# Patient Record
Sex: Female | Born: 1985 | Race: Black or African American | Hispanic: No | Marital: Single | Smoking: Current every day smoker
Health system: Southern US, Community
[De-identification: ages and names within clinical notes are randomized; demographics above are authoritative.]

## PROBLEM LIST (undated history)

## (undated) ENCOUNTER — Inpatient Hospital Stay: Payer: Self-pay

## (undated) DIAGNOSIS — I509 Heart failure, unspecified: Secondary | ICD-10-CM

## (undated) DIAGNOSIS — J45909 Unspecified asthma, uncomplicated: Secondary | ICD-10-CM

## (undated) DIAGNOSIS — K219 Gastro-esophageal reflux disease without esophagitis: Secondary | ICD-10-CM

## (undated) DIAGNOSIS — I10 Essential (primary) hypertension: Secondary | ICD-10-CM

## (undated) DIAGNOSIS — O1490 Unspecified pre-eclampsia, unspecified trimester: Secondary | ICD-10-CM

## (undated) HISTORY — DX: Unspecified asthma, uncomplicated: J45.909

## (undated) HISTORY — PX: DILATION AND CURETTAGE OF UTERUS: SHX78

---

## 2008-11-28 ENCOUNTER — Emergency Department: Payer: Self-pay | Admitting: Internal Medicine

## 2008-12-30 ENCOUNTER — Emergency Department: Payer: Self-pay | Admitting: Emergency Medicine

## 2009-06-04 ENCOUNTER — Emergency Department: Payer: Self-pay | Admitting: Emergency Medicine

## 2009-08-24 ENCOUNTER — Encounter: Payer: Self-pay | Admitting: Obstetrics and Gynecology

## 2009-09-01 ENCOUNTER — Emergency Department: Payer: Self-pay | Admitting: Emergency Medicine

## 2009-09-02 ENCOUNTER — Emergency Department: Payer: Self-pay | Admitting: Emergency Medicine

## 2009-12-21 ENCOUNTER — Observation Stay: Payer: Self-pay

## 2010-01-15 ENCOUNTER — Inpatient Hospital Stay: Payer: Self-pay

## 2010-01-20 ENCOUNTER — Inpatient Hospital Stay: Payer: Self-pay | Admitting: Internal Medicine

## 2012-03-04 ENCOUNTER — Emergency Department: Payer: Self-pay | Admitting: Emergency Medicine

## 2012-03-04 LAB — CBC
HCT: 32.3 % — ABNORMAL LOW (ref 35.0–47.0)
HGB: 10.7 g/dL — ABNORMAL LOW (ref 12.0–16.0)
MCHC: 33.1 g/dL (ref 32.0–36.0)
Platelet: 241 10*3/uL (ref 150–440)
RBC: 3.75 10*6/uL — ABNORMAL LOW (ref 3.80–5.20)
WBC: 6.9 10*3/uL (ref 3.6–11.0)

## 2012-03-04 LAB — COMPREHENSIVE METABOLIC PANEL
Albumin: 2.9 g/dL — ABNORMAL LOW (ref 3.4–5.0)
Alkaline Phosphatase: 42 U/L — ABNORMAL LOW (ref 50–136)
Anion Gap: 7 (ref 7–16)
BUN: 7 mg/dL (ref 7–18)
Bilirubin,Total: 0.2 mg/dL (ref 0.2–1.0)
Creatinine: 0.74 mg/dL (ref 0.60–1.30)
EGFR (African American): >60
EGFR (Non-African Amer.): >60
Glucose: 80 mg/dL (ref 65–99)
Osmolality: 274 (ref 275–301)
Potassium: 3.6 mmol/L (ref 3.5–5.1)
SGOT(AST): 14 U/L — ABNORMAL LOW (ref 15–37)
SGPT (ALT): 15 U/L
Total Protein: 7.1 g/dL (ref 6.4–8.2)

## 2012-03-04 LAB — URINALYSIS, COMPLETE
Bilirubin,UR: NEGATIVE
Glucose,UR: NEGATIVE mg/dL (ref 0–75)
Ketone: NEGATIVE
Ph: 8 (ref 4.5–8.0)
RBC,UR: 1 /HPF (ref 0–5)
Specific Gravity: 1.017 (ref 1.003–1.030)
Squamous Epithelial: 13
WBC UR: 3 /HPF (ref 0–5)

## 2012-04-21 ENCOUNTER — Observation Stay: Payer: Self-pay

## 2012-05-04 ENCOUNTER — Encounter: Payer: Self-pay | Admitting: Maternal & Fetal Medicine

## 2012-05-15 ENCOUNTER — Ambulatory Visit: Payer: Self-pay | Admitting: Family Medicine

## 2012-06-14 ENCOUNTER — Observation Stay: Payer: Self-pay

## 2012-07-19 ENCOUNTER — Inpatient Hospital Stay: Payer: Self-pay

## 2012-07-19 LAB — CBC WITH DIFFERENTIAL/PLATELET
Basophil %: 0.4 %
Eosinophil #: 0.1 10*3/uL (ref 0.0–0.7)
Eosinophil %: 0.7 %
HCT: 32.9 % — ABNORMAL LOW (ref 35.0–47.0)
Lymphocyte #: 2.3 10*3/uL (ref 1.0–3.6)
MCH: 28 pg (ref 26.0–34.0)
MCHC: 32.4 g/dL (ref 32.0–36.0)
Monocyte #: 0.7 x10 3/mm (ref 0.2–0.9)
Neutrophil %: 65.2 %
RBC: 3.81 10*6/uL (ref 3.80–5.20)
RDW: 13.6 % (ref 11.5–14.5)

## 2012-07-19 LAB — DRUG SCREEN, URINE
Barbiturates, Ur Screen: NEGATIVE (ref ?–200)
Benzodiazepine, Ur Scrn: NEGATIVE (ref ?–200)
Cocaine Metabolite,Ur ~~LOC~~: NEGATIVE (ref ?–300)
Methadone, Ur Screen: NEGATIVE (ref ?–300)
Phencyclidine (PCP) Ur S: NEGATIVE (ref ?–25)
Tricyclic, Ur Screen: NEGATIVE (ref ?–1000)

## 2012-07-20 LAB — HEMATOCRIT: HCT: 29.4 % — ABNORMAL LOW (ref 35.0–47.0)

## 2014-11-10 ENCOUNTER — Emergency Department: Payer: Self-pay | Admitting: Emergency Medicine

## 2014-11-10 LAB — COMPREHENSIVE METABOLIC PANEL
Albumin: 3.4 g/dL (ref 3.4–5.0)
Alkaline Phosphatase: 45 U/L — ABNORMAL LOW
Anion Gap: 10 (ref 7–16)
BILIRUBIN TOTAL: 0.2 mg/dL (ref 0.2–1.0)
BUN: 11 mg/dL (ref 7–18)
CALCIUM: 8.6 mg/dL (ref 8.5–10.1)
Chloride: 106 mmol/L (ref 98–107)
Co2: 26 mmol/L (ref 21–32)
Creatinine: 0.87 mg/dL (ref 0.60–1.30)
EGFR (African American): >60
EGFR (Non-African Amer.): >60
GLUCOSE: 89 mg/dL (ref 65–99)
Osmolality: 282 (ref 275–301)
Potassium: 3.9 mmol/L (ref 3.5–5.1)
SGOT(AST): 17 U/L (ref 15–37)
SGPT (ALT): 17 U/L
Sodium: 142 mmol/L (ref 136–145)
Total Protein: 7.3 g/dL (ref 6.4–8.2)

## 2014-11-10 LAB — CBC
HCT: 38.2 % (ref 35.0–47.0)
HGB: 12.1 g/dL (ref 12.0–16.0)
MCH: 27.5 pg (ref 26.0–34.0)
MCHC: 31.7 g/dL — ABNORMAL LOW (ref 32.0–36.0)
MCV: 87 fL (ref 80–100)
Platelet: 318 10*3/uL (ref 150–440)
RBC: 4.4 10*6/uL (ref 3.80–5.20)
RDW: 13 % (ref 11.5–14.5)
WBC: 8.2 10*3/uL (ref 3.6–11.0)

## 2014-11-10 LAB — LIPASE, BLOOD: LIPASE: 115 U/L (ref 73–393)

## 2014-11-10 LAB — HCG, QUANTITATIVE, PREGNANCY: BETA HCG, QUANT.: 2953 m[IU]/mL — AB

## 2015-05-09 NOTE — H&P (Signed)
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  Her past obstetrical history is notable for a history of post-partum severe preeclampsia, with CHF and pleural effusions.  She had "good left ventricular function" on ECHO. She was discharged from that admission on Lasix.  She presents with SROM at about 1.5 hours (midnight) prior to presenting. She notes no vaginal bleeding, positive fetal movement, and contractions. blood type O neg, RPR NR, RI, NGsAg neg, VZI, +GBS bacteruria.    Patient's Medical History No Chronic Illness    Patient's Surgical History none    Medications Pre Natal Vitamins  Iron    Allergies NKDA    Social History 2 cigs/day, denies EtOH and drug use    Family History Non-Contributory   ROS:   ROS All systems were reviewed.  HEENT, CNS, GI, GU, Respiratory, CV, Renal and Musculoskeletal systems were found to be normal., unless noted in HPI   Exam:   Vital Signs T 36.8, BP 122/88, P 83    General no apparent distress    Mental Status clear    Chest clear    Heart normal sinus rhythm    Abdomen gravid, non-tender    Estimated Fetal Weight Average for gestational age, 8# (largest baby =7lbs, 8oz)    Fetal Position vertex    Back no CVAT    Edema no edema    Pelvic 4cm per RN    Mebranes Ruptured    Description clear    FHT normal rate with no decels    Fetal Heart Rate 130    Ucx irregular, 3-4 q 10 min   Impression:   Impression active labor, Spontaneous rupture of membranes   Plan:   Plan EFM/NST, monitor contractions and for cervical change, antibiotics for GBBS prophylaxis, fluids    Comments -admit for labor and spontaneous rupture of membranes - amoxicillin for gbs prophylaxis - IVF and clear liquids - T&S and  CBC - expectant management initially until antibiotics on board for GBS+ - urine drug screen for marijuana this pregnancy - she desires an epidural at some point.   Electronic Signatures: Conard NovakJackson, Levante Simones D (MD)  (Signed 21-Jul-13 02:10)  Authored: L&D Evaluation   Last Updated: 21-Jul-13 02:10 by Conard NovakJackson, Jacelyn Cuen D (MD)

## 2015-08-15 ENCOUNTER — Emergency Department
Admission: EM | Admit: 2015-08-15 | Discharge: 2015-08-15 | Disposition: A | Discharge status: Home / Self Care | Payer: Self-pay | Attending: Emergency Medicine | Admitting: Emergency Medicine

## 2015-08-15 DIAGNOSIS — N390 Urinary tract infection, site not specified: Secondary | ICD-10-CM

## 2015-08-15 DIAGNOSIS — Z3491 Encounter for supervision of normal pregnancy, unspecified, first trimester: Secondary | ICD-10-CM

## 2015-08-15 DIAGNOSIS — Z3A13 13 weeks gestation of pregnancy: Secondary | ICD-10-CM | POA: Insufficient documentation

## 2015-08-15 DIAGNOSIS — O2341 Unspecified infection of urinary tract in pregnancy, first trimester: Secondary | ICD-10-CM | POA: Insufficient documentation

## 2015-08-15 LAB — BASIC METABOLIC PANEL
Anion gap: 6 (ref 5–15)
BUN: 11 mg/dL (ref 6–20)
CALCIUM: 8.6 mg/dL — AB (ref 8.9–10.3)
CO2: 24 mmol/L (ref 22–32)
CREATININE: 0.75 mg/dL (ref 0.44–1.00)
Chloride: 109 mmol/L (ref 101–111)
GFR calc non Af Amer: >60 mL/min (ref >60)
Glucose, Bld: 89 mg/dL (ref 65–99)
Potassium: 4.3 mmol/L (ref 3.5–5.1)
SODIUM: 139 mmol/L (ref 135–145)

## 2015-08-15 LAB — URINALYSIS COMPLETE WITH MICROSCOPIC (ARMC ONLY)
Bilirubin Urine: NEGATIVE
GLUCOSE, UA: NEGATIVE mg/dL
KETONES UR: NEGATIVE mg/dL
NITRITE: NEGATIVE
Protein, ur: NEGATIVE mg/dL
SPECIFIC GRAVITY, URINE: 1.015 (ref 1.005–1.030)
pH: 6 (ref 5.0–8.0)

## 2015-08-15 LAB — PREGNANCY, URINE: Preg Test, Ur: POSITIVE — AB

## 2015-08-15 LAB — CBC
HCT: 35.7 % (ref 35.0–47.0)
HEMOGLOBIN: 11.6 g/dL — AB (ref 12.0–16.0)
MCH: 27.3 pg (ref 26.0–34.0)
MCHC: 32.4 g/dL (ref 32.0–36.0)
MCV: 84.4 fL (ref 80.0–100.0)
Platelets: 221 10*3/uL (ref 150–440)
RBC: 4.23 MIL/uL (ref 3.80–5.20)
RDW: 13.1 % (ref 11.5–14.5)
WBC: 10.1 10*3/uL (ref 3.6–11.0)

## 2015-08-15 MED ORDER — CEPHALEXIN 500 MG PO CAPS: 500.0000 mg | ORAL_CAPSULE | Freq: Four times a day (QID) | ORAL | Status: AC

## 2015-08-15 MED ORDER — SODIUM CHLORIDE 0.9 % IV BOLUS (SEPSIS)
1000.0000 mL | Freq: Once | INTRAVENOUS | Status: AC
  Administered 2015-08-15: 1000 mL via INTRAVENOUS

## 2015-08-15 MED ORDER — CEFTRIAXONE SODIUM 1 G IJ SOLR
1.0000 g | INTRAMUSCULAR | Status: DC
  Administered 2015-08-15: 1 g via INTRAVENOUS
  Filled 2015-08-15: qty 10

## 2015-08-15 NOTE — ED Notes (Addendum)
Pt in with lower back pain and and generalized abd pain states hx of the same and was dx with UTI>.   Pt is pregnant but unsure of gestation, is scheduled for abortion Thursday.

## 2015-08-15 NOTE — ED Provider Notes (Signed)
CSN: [id number]     Arrival date & time 08/15/15  0446 History   First MD Initiated Contact with Patient 08/15/15 [phone]     Chief Complaint  Patient presents with  . Abdominal Pain     (Consider location/radiation/quality/duration/timing/severity/associated sxs/prior Treatment) The history is provided by the patient.  Natasha Christian is a 29 y.o. female here with lower abdominal pain, dysuria. Patient has been having lower abdominal pain as well as curious since yesterday. Also some low back pain as well. Had one episode of vomiting that resolved. Denies any fevers or chills. Patient's last menstrual period was middle of May and she states that she may be "weeks pregnant but is scheduled for an abortion later this week. Denies any vaginal bleeding.    No past medical history on file. No past surgical history on file. No family history on file. Social History  Substance Use Topics  . Smoking status: Not on file  . Smokeless tobacco: Not on file  . Alcohol Use: Not on file   OB History    No data available     Review of Systems  Gastrointestinal: Positive for vomiting and abdominal pain.  All other systems reviewed and are negative.     Allergies  Review of patient's allergies indicates no known allergies.  Home Medications   Prior to Admission medications   Not on File   BP 109/66 mmHg  Pulse 52  Temp(Src) 97.8 F (36.6 C) (Oral)  Resp 18  Ht  (1.727 m)  Wt 200 lb (90.719 kg)  BMI 30.42 kg/m2  SpO2 99%  LMP 05/13/2015 Physical Exam  Constitutional: She is oriented to person, place, and time. She appears well-developed and well-nourished.  HENT:  Head: Normocephalic.  Mouth/Throat: Oropharynx is clear and moist.  Eyes: Conjunctivae are normal. Pupils are equal, round, and reactive to light.  Neck: Normal range of motion. Neck supple.  Cardiovascular: Normal rate, regular rhythm and normal heart sounds.   Pulmonary/Chest: Effort normal and breath sounds  normal. No respiratory distress. She has no wheezes. She has no rales.  Abdominal: Soft. Bowel sounds are normal.  Minimal diffuse lower abdominal tenderness, mostly uterine area   Musculoskeletal: Normal range of motion. She exhibits no edema or tenderness.  Neurological: She is alert and oriented to person, place, and time. No cranial nerve deficit. Coordination normal.  Skin: Skin is warm and dry.  Psychiatric: She has a normal mood and affect. Her behavior is normal. Judgment and thought content normal.  Nursing note and vitals reviewed.   ED Course  Procedures (including critical care time)  EMERGENCY DEPARTMENT [place] PREGNANCY "Study: Limited Ultrasound of the Pelvis"  INDICATIONS:Pregnancy(required) Multiple views of the uterus and pelvic cavity are obtained with a multi-frequency probe.  APPROACH:Transabdominal   PERFORMED BY: Myself  IMAGES ARCHIVED?: Yes  LIMITATIONS: Body habitus  PREGNANCY FREE FLUID: None  PREGNANCY UTERUS FINDINGS:Uterus enlarged ADNEXAL FINDINGS:Left ovary not seen and Right ovary not seen  PREGNANCY FINDINGS: Intrauterine gestational sac noted, Yolk sac noted, Fetal pole present and Fetal heart activity seen  INTERPRETATION: Viable intrauterine pregnancy  GESTATIONAL AGE, ESTIMATE: 12 weeks 4 days  FETAL HEART RATE: 150  COMMENT(Estimate of Gestational Age):       Labs Review Labs Reviewed  CBC - Abnormal; Notable for the following:    Hemoglobin 11.6 (*)    All other components within normal limits  BASIC METABOLIC PANEL - Abnormal; Notable for the following:    Calcium 8.6 (*)  All other components within normal limits  URINALYSIS COMPLETEWITH MICROSCOPIC ([facility] ONLY) - Abnormal; Notable for the following:    Color, Urine YELLOW (*)    APPearance CLOUDY (*)    Hgb urine dipstick 1+ (*)    Leukocytes, UA 3+ (*)    Bacteria, UA RARE (*)    Squamous Epithelial / LPF TOO NUMEROUS TO COUNT (*)    All other components within  normal limits  PREGNANCY, URINE - Abnormal; Notable for the following:    Preg Test, Ur POSITIVE (*)    All other components within normal limits    Imaging Review No results found. I, YAO, DAVID, personally reviewed and evaluated these images and lab results as part of my medical decision-making.   EKG Interpretation None      MDM   Final diagnoses:  None    Natasha Christian is a 29 y.o. female here with lower ab pain, dysuria. No vaginal bleeding. Likely UTI. IUP confirmed by bedside US. Will check labs, UA, hydrate and reassess.   8:22 AM UA + UTI. Labs otherwise unremarkable. Given rocephin, will dc home with keflex. Has abortion scheduled in several days.    Richardean Canal, MD 08/15/15 [phone]

## 2015-08-15 NOTE — Discharge Instructions (Signed)
Stay hydrated.  Take tylenol for pain.  Take keflex four times daily for a week.   See your OB doctor.   Return to ER if you have fever, vomiting, vaginal bleeding, severe abdominal pain

## 2015-08-17 LAB — URINE CULTURE

## 2015-11-13 ENCOUNTER — Telehealth: Payer: Self-pay | Admitting: Obstetrics and Gynecology

## 2015-11-13 ENCOUNTER — Encounter: Payer: Self-pay | Admitting: *Deleted

## 2015-11-13 ENCOUNTER — Observation Stay
Admission: EM | Admit: 2015-11-13 | Discharge: 2015-11-13 | Disposition: A | Discharge status: Home / Self Care | Payer: Self-pay | Attending: Obstetrics and Gynecology | Admitting: Obstetrics and Gynecology

## 2015-11-13 DIAGNOSIS — N760 Acute vaginitis: Secondary | ICD-10-CM | POA: Insufficient documentation

## 2015-11-13 DIAGNOSIS — O26899 Other specified pregnancy related conditions, unspecified trimester: Secondary | ICD-10-CM

## 2015-11-13 DIAGNOSIS — B9689 Other specified bacterial agents as the cause of diseases classified elsewhere: Secondary | ICD-10-CM | POA: Diagnosis present

## 2015-11-13 DIAGNOSIS — O23592 Infection of other part of genital tract in pregnancy, second trimester: Principal | ICD-10-CM | POA: Insufficient documentation

## 2015-11-13 DIAGNOSIS — Z3A25 25 weeks gestation of pregnancy: Secondary | ICD-10-CM | POA: Insufficient documentation

## 2015-11-13 DIAGNOSIS — O0932 Supervision of pregnancy with insufficient antenatal care, second trimester: Secondary | ICD-10-CM

## 2015-11-13 DIAGNOSIS — R109 Unspecified abdominal pain: Secondary | ICD-10-CM

## 2015-11-13 DIAGNOSIS — O99332 Smoking (tobacco) complicating pregnancy, second trimester: Secondary | ICD-10-CM | POA: Insufficient documentation

## 2015-11-13 HISTORY — DX: Heart failure, unspecified: I50.9

## 2015-11-13 LAB — URINALYSIS COMPLETE WITH MICROSCOPIC (ARMC ONLY)
BACTERIA UA: NONE SEEN
Bilirubin Urine: NEGATIVE
Glucose, UA: NEGATIVE mg/dL
Hgb urine dipstick: NEGATIVE
KETONES UR: NEGATIVE mg/dL
Nitrite: NEGATIVE
PROTEIN: NEGATIVE mg/dL
Specific Gravity, Urine: 1.02 (ref 1.005–1.030)
pH: 6 (ref 5.0–8.0)

## 2015-11-13 LAB — TYPE AND SCREEN
ABO/RH(D): O NEG
ANTIBODY SCREEN: NEGATIVE

## 2015-11-13 LAB — PROTEIN / CREATININE RATIO, URINE
CREATININE, URINE: 190 mg/dL
Protein Creatinine Ratio: 0.07 mg/mg{Cre} (ref 0.00–0.15)
Total Protein, Urine: 13 mg/dL

## 2015-11-13 LAB — CBC
HCT: 32.2 % — ABNORMAL LOW (ref 35.0–47.0)
HEMOGLOBIN: 10.7 g/dL — AB (ref 12.0–16.0)
MCH: 28.8 pg (ref 26.0–34.0)
MCHC: 33.2 g/dL (ref 32.0–36.0)
MCV: 86.8 fL (ref 80.0–100.0)
Platelets: 197 10*3/uL (ref 150–440)
RBC: 3.71 MIL/uL — AB (ref 3.80–5.20)
RDW: 13.1 % (ref 11.5–14.5)
WBC: 8.8 10*3/uL (ref 3.6–11.0)

## 2015-11-13 LAB — URINE DRUG SCREEN, QUALITATIVE (ARMC ONLY)
Amphetamines, Ur Screen: NOT DETECTED — AB
BARBITURATES, UR SCREEN: NOT DETECTED — AB
BENZODIAZEPINE, UR SCRN: NOT DETECTED — AB
CANNABINOID 50 NG, UR ~~LOC~~: POSITIVE — AB
Cocaine Metabolite,Ur ~~LOC~~: NOT DETECTED — AB
MDMA (Ecstasy)Ur Screen: NOT DETECTED — AB
METHADONE SCREEN, URINE: NOT DETECTED — AB
OPIATE, UR SCREEN: NOT DETECTED — AB
Phencyclidine (PCP) Ur S: NOT DETECTED — AB
TRICYCLIC, UR SCREEN: NOT DETECTED — AB

## 2015-11-13 LAB — RAPID HIV SCREEN (HIV 1/2 AB+AG)
HIV 1/2 Antibodies: NONREACTIVE
HIV-1 P24 Antigen - HIV24: NONREACTIVE

## 2015-11-13 LAB — COMPREHENSIVE METABOLIC PANEL
ALK PHOS: 51 U/L (ref 38–126)
ALT: 14 U/L (ref 14–54)
ANION GAP: 5 (ref 5–15)
AST: 16 U/L (ref 15–41)
Albumin: 2.9 g/dL — ABNORMAL LOW (ref 3.5–5.0)
BILIRUBIN TOTAL: 0.1 mg/dL — AB (ref 0.3–1.2)
BUN: 8 mg/dL (ref 6–20)
CALCIUM: 8.3 mg/dL — AB (ref 8.9–10.3)
CO2: 24 mmol/L (ref 22–32)
Chloride: 107 mmol/L (ref 101–111)
Creatinine, Ser: 0.62 mg/dL (ref 0.44–1.00)
Glucose, Bld: 81 mg/dL (ref 65–99)
Potassium: 3.5 mmol/L (ref 3.5–5.1)
Sodium: 136 mmol/L (ref 135–145)
TOTAL PROTEIN: 6.6 g/dL (ref 6.5–8.1)

## 2015-11-13 LAB — CHLAMYDIA/NGC RT PCR (ARMC ONLY)
Chlamydia Tr: NOT DETECTED
N GONORRHOEAE: NOT DETECTED

## 2015-11-13 MED ORDER — METRONIDAZOLE 500 MG PO TABS: 500.0000 mg | ORAL_TABLET | Freq: Two times a day (BID) | ORAL | Status: AC

## 2015-11-13 NOTE — Progress Notes (Signed)
No further monitoring required, per MD

## 2015-11-13 NOTE — Discharge Instructions (Signed)

## 2015-11-13 NOTE — OB Triage Note (Signed)
Pt. Present with concerns of vaginal pressure for the last couple of weeks, no vaginal bleeding or discharge.  Has not received prenatal care because "I was undecided about having this baby" due to transportation problems and housing. Pt. Lives with her dad, along with her three children.

## 2015-11-13 NOTE — Final Progress Note (Signed)
Physician Final Progress Note  Patient ID: Natasha Christian MRN: [id number] DOB/AGE: 1986-02-20 29 y.o.  Admit date: 11/13/2015 Admitting provider: Osmond Bing, MD Discharge date: 11/13/2015  6:17 PM Primary OBGYN: No prenatal care  Chief Complaint: low belly pressure x 2 weeks  History of Present Illness  29 y.o. [id number] @ 25/3 (Dating: EDC 2/24, based on bedside ER u/s at 12wks), with the above CC. Pregnancy complicated by: no PNC, h/o pre-eclampsia with severe features and/or ?cardiomyopathy, h/o CIN 2, poor patient follow up, tobacco abuse, h/o GBS bacteruria and STIs.  Ms. Natasha Christian states that she hasn't had any PNC from any provider, aside from the one [facility] ER visit. Two week history of stable to ?worsening today with low belly vaginal/belly pressure. No decreased FM, VB, LOF or anything per vagina in the past two days. Patient works a Marine scientist job and was wondering if this contributed to her s/s. No chest pain, SOB, dysuria, hematuria  Review of Systems: her 12 point review of systems is negative or as noted in the History of Present Illness.   PMHx:  Past Medical History  Diagnosis Date  . CHF (congestive heart failure) (HCC)     After giving birth 2011, pt. has to return to hospital for "fluid around lungs and heart".    PSHx:  Past Surgical History  Procedure Laterality Date  . Dilation and curettage of uterus      elective AB     Medications:  PNV  Allergies: has No Known Allergies. OBHx:  OB History  Gravida Para Term Preterm AB SAB TAB Ectopic Multiple Living  0 2 1 0 0 0 3    # Outcome Date GA Lbr Len/2nd Weight Sex Delivery Anes PTL Lv  6 Current           5 SAB 10/30/14        FD  4 Term 07/19/12   6 lb 12 oz (3.062 kg) M Vag-Spont   Y  3 Term 01/16/10   6 lb 11 oz (3.033 kg) M Vag-Spont  N Y  2 AB 03/30/09        FD  1 Gravida 12/18/03   7 lb 11 oz (3.487 kg) M Vag-Spont  N Y     GYNHx:  History of abnormal pap smears: h/o CIN 2 and  unknown follow up by patient History of STIs: Yes.  Marland Kitchen             FHx: History reviewed. No pertinent family history. Soc Hx:  Social History   Social History  . Marital Status: Single    Spouse Name: N/A  . Number of Children: N/A  . Years of Education: N/A   Occupational History  . Not on file.   Social History Main Topics  . Smoking status: Current Every Day Smoker    Types: Cigarettes  . Smokeless tobacco: Not on file  . Alcohol Use: No  . Drug Use: No  . Sexual Activity: Not Currently   Other Topics Concern  . Not on file   Social History Narrative  . No narrative on file    Objective    Current Vital Signs 24h Vital Sign Ranges  T 99 F (37.2 C) Temp  Avg: 99 F (37.2 C)  Min: 99 F (37.2 C)  Max: 99 F (37.2 C)  BP (!) 115/55 mmHg BP  Min: 115/55  Max: 115/55  HR 72 Pulse  Avg: 72  Min: 72  Max: 72  RR 18 Resp  Avg: 18  Min: 18  Max: 18  SaO2    98/RA No Data Recorded       24 Hour I/O Current Shift I/O  Time Ins Outs       EFM: 130 baseline, +accels, no decels, mod variability  Toco: quiet  General: Well nourished, well developed female in no acute distress.  Skin:  Warm and dry.  Cardiovascular: Regular rate and rhythm. Respiratory:  Clear to auscultation bilateral. Normal respiratory effort Abdomen: gravid, nttp.  Neuro/Psych:  Normal mood and affect.   SSE: visually closed, normal minimal white d/c in vault, somewhat malodorous SVE: cl/long/high  Labs  +nitrazine WP: numerous WBCs and clue cells. No trich  Radiology BSUS with SLIUP with normal FHR, subjectively normal fluid, cephalic, anterior placenta and no e/o previa   Assessment & Plan  No e/o PTL or cervical dilation; pt with BV.  *IUP: fetal status reassuring with rNST. Pt told that BSUS was just for quick assessment and not to check anatomy and that she still needs a formal u/s *No PNC: UDS and NOB panel sent along with GC/CT. FFN not sent given negative toco and exam. Pt  states she has [facility] appt next week and was told to keep that *?h/o CM: no e/o sequelae. Pt [facility]; pt had normal 2013 VD and no issues PP. Needs baseline echo at some point. Stressed to patient importance of follow up *ID: follow up labs. D/c home with flagyl  Natasha Christian, Jr. MD [facility] Pager [phone]

## 2015-11-14 LAB — RUBELLA SCREEN

## 2015-11-14 LAB — ABO/RH: ABO/RH(D): O NEG

## 2015-11-14 LAB — VARICELLA ZOSTER ANTIBODY, IGG: VARICELLA IGG: 615 {index} (ref >165)

## 2015-11-14 LAB — HEPATITIS B SURFACE ANTIGEN: HEP B S AG: NEGATIVE

## 2015-11-14 LAB — RPR: RPR: NONREACTIVE

## 2015-11-15 LAB — URINE CULTURE

## 2016-09-17 ENCOUNTER — Encounter (HOSPITAL_COMMUNITY): Payer: Self-pay

## 2017-02-20 ENCOUNTER — Emergency Department
Admission: EM | Admit: 2017-02-20 | Discharge: 2017-02-20 | Disposition: A | Discharge status: Home / Self Care | Payer: Managed Care, Other (non HMO) | Attending: Emergency Medicine | Admitting: Emergency Medicine

## 2017-02-20 DIAGNOSIS — J069 Acute upper respiratory infection, unspecified: Secondary | ICD-10-CM | POA: Insufficient documentation

## 2017-02-20 DIAGNOSIS — L0231 Cutaneous abscess of buttock: Secondary | ICD-10-CM | POA: Diagnosis not present

## 2017-02-20 DIAGNOSIS — F17210 Nicotine dependence, cigarettes, uncomplicated: Secondary | ICD-10-CM | POA: Diagnosis not present

## 2017-02-20 DIAGNOSIS — I509 Heart failure, unspecified: Secondary | ICD-10-CM | POA: Diagnosis not present

## 2017-02-20 DIAGNOSIS — B9789 Other viral agents as the cause of diseases classified elsewhere: Secondary | ICD-10-CM

## 2017-02-20 DIAGNOSIS — R05 Cough: Secondary | ICD-10-CM | POA: Diagnosis present

## 2017-02-20 MED ORDER — SULFAMETHOXAZOLE-TRIMETHOPRIM 800-160 MG PO TABS: 1.0000 | ORAL_TABLET | Freq: Two times a day (BID) | ORAL | 0 refills | Status: DC

## 2017-02-20 MED ORDER — LIDOCAINE HCL (PF) 1 % IJ SOLN
INTRAMUSCULAR | Status: AC
  Filled 2017-02-20: qty 5

## 2017-02-20 MED ORDER — HYDROCODONE-ACETAMINOPHEN 5-325 MG PO TABS: 1.0000 | ORAL_TABLET | ORAL | 0 refills | Status: DC | PRN

## 2017-02-20 MED ORDER — LIDOCAINE HCL (PF) 1 % IJ SOLN
5.0000 mL | Freq: Once | INTRAMUSCULAR | Status: AC
  Administered 2017-02-20: 09:00:00

## 2017-02-20 NOTE — Discharge Instructions (Signed)
Begin taking Bactrim DS twice a day for 10 days. Norco as needed for pain. You  cannot drive or operate machinery while taking this medication as it could cause drowsiness. [facility] packing removal in 2 days. You  should also begin at that time using warm compresses or sitting in warm water frequently. Your should  also follow up with a surgeon.  Dr. Evette CristalSankar is the surgeon on call.

## 2017-02-20 NOTE — ED Notes (Signed)
Pt c/o flu like symptoms at this time this time, c/o cough, nausea, 1 episode of vomiting. Pt also c/o abscess to inner R buttock at this time, redness and swelling noted at this time. Pt is alert and oriented. Skin warm, dry, and intact, respirations even and unlabored at this time.

## 2017-02-20 NOTE — ED Triage Notes (Signed)
Pt c/o cough with congestion, pt also c/o "boil" to her buttock for the past 2 days.

## 2017-02-20 NOTE — ED Provider Notes (Signed)
[facility] Emergency Department Provider Note  ____________________________________________   First MD Initiated Contact with Patient 02/20/17 0848     (approximate)  I have reviewed the triage vital signs and the nursing notes.   HISTORY  Chief Complaint Abscess and Cough   HPI Natasha SparkCiarra Christian is a 31 y.o. female is here with 2 different complaints. Patient first complains of cough and congestion for approximately one week. She denies any fever or chills. There's been no nausea or vomiting. She states that there is a nonproductive cough. She's been takes over-the-counter medication with some relief of her cough. She is also here because she has "boil". To her right buttocks that is started in the last 2 days. Patient states there is been several years since she has had one but that also had to be lanced. Patient rates her pain as 10 over 10. Patient drove herself to the emergency room and there are no other people with her.   Past Medical History:  Diagnosis Date  . CHF (congestive heart failure) (HCC)    After giving birth 2011, pt. has to return to hospital for "fluid around lungs and heart".     Patient Active Problem List   Diagnosis Date Noted  . No prenatal care in current pregnancy in second trimester 11/13/2015  . Abdominal pain affecting pregnancy, antepartum 11/13/2015  . Bacterial vaginosis 11/13/2015    Past Surgical History:  Procedure Laterality Date  . DILATION AND CURETTAGE OF UTERUS     elective AB    Prior to Admission medications   Medication Sig Start Date End Date Taking? Authorizing Provider  HYDROcodone-acetaminophen (NORCO/VICODIN) 5-325 MG tablet Take 1 tablet by mouth every 4 (four) hours as needed for moderate pain. 02/20/17   Tommi Rumpshonda L Abigail Marsiglia, PA-C  Prenatal Vit-Fe Fumarate-FA (PRENATAL MULTIVITAMIN) TABS tablet Take 1 tablet by mouth daily at 12 noon.    Historical Provider, MD  sulfamethoxazole-trimethoprim (BACTRIM  DS,SEPTRA DS) 800-160 MG tablet Take 1 tablet by mouth 2 (two) times daily. 02/20/17   Tommi Rumpshonda L Harshini Trent, PA-C    Allergies Patient has no known allergies.  No family history on file.  Social History Social History  Substance Use Topics  . Smoking status: Current Every Day Smoker    Types: Cigarettes  . Smokeless tobacco: Never Used  . Alcohol use No    Review of Systems Constitutional: No fever/chills Eyes: No visual changes. ENT: No sore throat.Positive nasal congestion. Cardiovascular: Denies chest pain. Respiratory: Denies shortness of breath. Positive nonproductive cough. Gastrointestinal: No abdominal pain.  No nausea, no vomiting.  No diarrhea. Musculoskeletal: Negative for back pain. Skin: Positive for abscesses. Neurological: Negative for headaches, focal weakness or numbness.  10-point ROS otherwise negative.  ____________________________________________   PHYSICAL EXAM:  VITAL SIGNS: ED Triage Vitals  Enc Vitals Group     BP 02/20/17 0832 138/90     Pulse Rate 02/20/17 0832 84     Resp 02/20/17 0832 16     Temp 02/20/17 0832 98.1 F (36.7 C)     Temp Source 02/20/17 0832 Oral     SpO2 02/20/17 0832 100 %     Weight 02/20/17 0829 170 lb (77.1 kg)     Height 02/20/17 0829 5\' 8"  (1.727 m)     Head Circumference --      Peak Flow --      Pain Score 02/20/17 0829 10     Pain Loc --      Pain Edu? --  Excl. in GC? --     Constitutional: Alert and oriented. Well appearing and in no acute distress. Eyes: Conjunctivae are normal. PERRL. EOMI. Head: Atraumatic. Nose: Minimal congestion/rhinnorhea.  EACs and TMs are clear bilaterally. Mouth/Throat: Mucous membranes are moist.  Oropharynx non-erythematous. Neck: No stridor.  Hematological/Lymphatic/Immunilogical: No cervical lymphadenopathy. Cardiovascular: Normal rate, regular rhythm. Grossly normal heart sounds.  Good peripheral circulation. Respiratory: Normal respiratory effort.  No retractions.  Lungs CTAB. Gastrointestinal: Soft and nontender. No distention.  Musculoskeletal: Moves upper and lower extremities without difficulty. Normal gait was noted. Neurologic:  Normal speech and language. No gross focal neurologic deficits are appreciated. No gait instability. Skin:  Skin is warm, dry and intact. On examination there is a fluctuant abscess noted on the inner right buttocks. There is minimal erythema present. There is no warmth. There is moderate tenderness to palpation. Psychiatric: Mood and affect are normal. Speech and behavior are normal.  ____________________________________________   LABS (all labs ordered are listed, but only abnormal results are displayed)  Labs Reviewed - No data to display  PROCEDURES  Procedure(s) performed: INCISION AND DRAINAGE Performed by: Tommi Rumps Consent: Verbal consent obtained. Risks and benefits: risks, benefits and alternatives were discussed Type: abscess  Body area: Right buttocks  Anesthesia: local infiltration  Incision was made with a scalpel.  Local anesthetic: lidocaine 1 % without epinephrine  Anesthetic total: 3 ml  Complexity: complex Blunt dissection to break up loculations  Drainage: purulent  Drainage amount: Moderate   Packing material: 1/4 in iodoform gauze  Patient tolerance: Patient tolerated the procedure well with no immediate complications.    Procedures  Critical Care performed: No  ____________________________________________   INITIAL IMPRESSION / ASSESSMENT AND PLAN / ED COURSE  Pertinent labs & imaging results that were available during my care of the patient were reviewed by me and considered in my medical decision making (see chart for details).  Patient tolerated procedure well. Patient was started on Bactrim DS twice a day for 10 days and Norco as needed for pain. She is aware that she is to follow-up with someone in 2 days for packing removal. She'll also start using warm  compresses to the area frequently. She was given a name of a surgeon on call should she continue to have problems in this place.   Dr. Evette Cristal is the surgeon on call today     ____________________________________________   FINAL CLINICAL IMPRESSION(S) / ED DIAGNOSES  Final diagnoses:  Abscess of buttock, right  Viral URI with cough      NEW MEDICATIONS STARTED DURING THIS VISIT:  Discharge Medication List as of 02/20/2017 10:24 AM    START taking these medications   Details  HYDROcodone-acetaminophen (NORCO/VICODIN) 5-325 MG tablet Take 1 tablet by mouth every 4 (four) hours as needed for moderate pain., Starting Thu 02/20/2017, Print    sulfamethoxazole-trimethoprim (BACTRIM DS,SEPTRA DS) 800-160 MG tablet Take 1 tablet by mouth 2 (two) times daily., Starting Thu 02/20/2017, Print         Note:  This document was prepared using Dragon voice recognition software and may include unintentional dictation errors.    Tommi Rumps, PA-C 02/20/17 1201    Jennye Moccasin, MD 02/20/17 [phone]

## 2018-06-26 ENCOUNTER — Emergency Department
Admission: EM | Admit: 2018-06-26 | Discharge: 2018-06-26 | Disposition: A | Discharge status: Home / Self Care | Payer: Managed Care, Other (non HMO) | Attending: Emergency Medicine | Admitting: Emergency Medicine

## 2018-06-26 ENCOUNTER — Other Ambulatory Visit: Payer: Self-pay

## 2018-06-26 DIAGNOSIS — I110 Hypertensive heart disease with heart failure: Secondary | ICD-10-CM | POA: Insufficient documentation

## 2018-06-26 DIAGNOSIS — F17210 Nicotine dependence, cigarettes, uncomplicated: Secondary | ICD-10-CM | POA: Insufficient documentation

## 2018-06-26 DIAGNOSIS — N390 Urinary tract infection, site not specified: Secondary | ICD-10-CM | POA: Insufficient documentation

## 2018-06-26 DIAGNOSIS — I509 Heart failure, unspecified: Secondary | ICD-10-CM | POA: Insufficient documentation

## 2018-06-26 HISTORY — DX: Essential (primary) hypertension: I10

## 2018-06-26 HISTORY — DX: Unspecified pre-eclampsia, unspecified trimester: O14.90

## 2018-06-26 LAB — CBC
HCT: 37.3 % (ref 35.0–47.0)
Hemoglobin: 12.6 g/dL (ref 12.0–16.0)
MCH: 28.5 pg (ref 26.0–34.0)
MCHC: 33.8 g/dL (ref 32.0–36.0)
MCV: 84.4 fL (ref 80.0–100.0)
PLATELETS: 288 10*3/uL (ref 150–440)
RBC: 4.42 MIL/uL (ref 3.80–5.20)
RDW: 11.9 % (ref 11.5–14.5)
WBC: 11.9 10*3/uL — ABNORMAL HIGH (ref 3.6–11.0)

## 2018-06-26 LAB — URINALYSIS, COMPLETE (UACMP) WITH MICROSCOPIC
Bilirubin Urine: NEGATIVE
Glucose, UA: NEGATIVE mg/dL
KETONES UR: NEGATIVE mg/dL
Nitrite: POSITIVE — AB
PH: 6 (ref 5.0–8.0)
Protein, ur: 30 mg/dL — AB
Specific Gravity, Urine: 1.011 (ref 1.005–1.030)
WBC, UA: >50 WBC/hpf — ABNORMAL HIGH (ref 0–5)

## 2018-06-26 LAB — COMPREHENSIVE METABOLIC PANEL
ALBUMIN: 3.4 g/dL — AB (ref 3.5–5.0)
ALT: 20 U/L (ref 0–44)
AST: 24 U/L (ref 15–41)
Alkaline Phosphatase: 53 U/L (ref 38–126)
Anion gap: 8 (ref 5–15)
BUN: 7 mg/dL (ref 6–20)
CHLORIDE: 103 mmol/L (ref 98–111)
CO2: 24 mmol/L (ref 22–32)
CREATININE: 0.81 mg/dL (ref 0.44–1.00)
Calcium: 8.4 mg/dL — ABNORMAL LOW (ref 8.9–10.3)
GFR calc Af Amer: >60 mL/min (ref >60)
GFR calc non Af Amer: >60 mL/min (ref >60)
GLUCOSE: 137 mg/dL — AB (ref 70–99)
Potassium: 3.9 mmol/L (ref 3.5–5.1)
SODIUM: 135 mmol/L (ref 135–145)
Total Bilirubin: 0.4 mg/dL (ref 0.3–1.2)
Total Protein: 7.6 g/dL (ref 6.5–8.1)

## 2018-06-26 LAB — POCT PREGNANCY, URINE: Preg Test, Ur: NEGATIVE

## 2018-06-26 LAB — LIPASE, BLOOD: LIPASE: 22 U/L (ref 11–51)

## 2018-06-26 MED ORDER — CEFTRIAXONE SODIUM 1 G IJ SOLR
1.0000 g | Freq: Once | INTRAMUSCULAR | Status: AC
  Administered 2018-06-26: 1 g via INTRAMUSCULAR
  Filled 2018-06-26: qty 10

## 2018-06-26 MED ORDER — SULFAMETHOXAZOLE-TRIMETHOPRIM 800-160 MG PO TABS
1.0000 | ORAL_TABLET | Freq: Two times a day (BID) | ORAL | 0 refills | Status: DC
Start: 2018-06-26 — End: 2018-09-09

## 2018-06-26 MED ORDER — ACETAMINOPHEN 500 MG PO TABS
1000.0000 mg | ORAL_TABLET | Freq: Once | ORAL | Status: AC
  Administered 2018-06-26: 1000 mg via ORAL
  Filled 2018-06-26: qty 2

## 2018-06-26 MED ORDER — LIDOCAINE HCL (PF) 1 % IJ SOLN
1.9000 mL | Freq: Once | INTRAMUSCULAR | Status: AC
  Administered 2018-06-26: 1.9 mL via INTRADERMAL
  Filled 2018-06-26: qty 5

## 2018-06-26 NOTE — Discharge Instructions (Signed)
Begin taking antibiotics twice a day for the next 10 days.  Increase fluids.  Tylenol if needed for headaches or body aches.  You were also given an injection of antibiotics today in the ED.  Return to the emergency department if any worsening of your symptoms such as fever above 101, vomiting or inability to take antibiotics.  A culture was ordered on your urine today and should the antibiotic that you are placed on not cover all the germs that are in your urine you will get a phone call.

## 2018-06-26 NOTE — ED Notes (Signed)
When asked about pain says she now has a frontal throbbing headache 10/10.

## 2018-06-26 NOTE — ED Triage Notes (Signed)
Pt c/o generalized abd pain with nausea for the past week. Denies vomiting, diarrhea or constipations, states she is having normal BM.

## 2018-06-26 NOTE — ED Provider Notes (Signed)
[facility] Emergency Department Provider Note  ____________________________________________   First MD Initiated Contact with Patient 06/26/18 1344     (approximate)  I have reviewed the triage vital signs and the nursing notes.   HISTORY  Chief Complaint Abdominal Pain   HPI Natasha Christian is a 32 y.o. female presents to the ED with complaint of generalized abdominal pain beginning this week.  Patient has a history of urinary tract infections but not since "a long time ago".  Patient denies any vomiting, diarrhea, constipation.  She states she has had a subjective fever.  Currently she complains of a headache.  She rates her pain as an 8 out of 10.   Past Medical History:  Diagnosis Date  . CHF (congestive heart failure) (HCC)    After giving birth 2011, pt. has to return to hospital for "fluid around lungs and heart".   . Hypertension   . Pre-eclampsia     Patient Active Problem List   Diagnosis Date Noted  . No prenatal care in current pregnancy in second trimester 11/13/2015  . Abdominal pain affecting pregnancy, antepartum 11/13/2015  . Bacterial vaginosis 11/13/2015    Past Surgical History:  Procedure Laterality Date  . DILATION AND CURETTAGE OF UTERUS     elective AB    Prior to Admission medications   Medication Sig Start Date End Date Taking? Authorizing Provider  HYDROcodone-acetaminophen (NORCO/VICODIN) 5-325 MG tablet Take 1 tablet by mouth every 4 (four) hours as needed for moderate pain. 02/20/17   Tommi Rumps, PA-C  Prenatal Vit-Fe Fumarate-FA (PRENATAL MULTIVITAMIN) TABS tablet Take 1 tablet by mouth daily at 12 noon.    [provider]  sulfamethoxazole-trimethoprim (BACTRIM DS,SEPTRA DS) 800-160 MG tablet Take 1 tablet by mouth 2 (two) times daily. 06/26/18   Tommi Rumps, PA-C    Allergies Patient has no known allergies.  No family history on file.  Social History Social History   Tobacco Use  .  Smoking status: Current Every Day Smoker    Types: Cigarettes  . Smokeless tobacco: Never Used  Substance Use Topics  . Alcohol use: No  . Drug use: No    Review of Systems Constitutional: Subjective fever/chills Eyes: No visual changes. Cardiovascular: Denies chest pain. Respiratory: Denies shortness of breath. Gastrointestinal: Positive abdominal pain.  Positive nausea, no vomiting.   Genitourinary: Negative for dysuria. Musculoskeletal: Negative for back pain. Skin: Negative for rash. Neurological: Negative for headaches, focal weakness or numbness. ____________________________________________   PHYSICAL EXAM:  VITAL SIGNS: ED Triage Vitals  Enc Vitals Group     BP 06/26/18 1135 131/69     Pulse Rate 06/26/18 1135 88     Resp 06/26/18 1135 18     Temp 06/26/18 1135 99.1 F (37.3 C)     Temp Source 06/26/18 1135 Oral     SpO2 06/26/18 1135 100 %     Weight 06/26/18 1135 219 lb (99.3 kg)     Height 06/26/18 1135 5\' 8"  (1.727 m)     Head Circumference --      Peak Flow --      Pain Score 06/26/18 1139 8     Pain Loc --      Pain Edu? --      Excl. in GC? --    Constitutional: Alert and oriented. Well appearing and in no acute distress. Eyes: Conjunctivae are normal.  Head: Atraumatic. Neck: No stridor.   Cardiovascular: Normal rate, regular rhythm. Grossly normal heart  sounds.  Good peripheral circulation. Respiratory: Normal respiratory effort.  No retractions. Lungs CTAB. Gastrointestinal: Soft and nontender. No distention.  Bowel sounds normoactive x4 quadrants.  No CVA tenderness. Musculoskeletal: Moves upper and lower extremities without any difficulty.  Normal gait was noted. Neurologic:  Normal speech and language. No gross focal neurologic deficits are appreciated.  Skin:  Skin is warm, dry and intact. No rash noted. Psychiatric: Mood and affect are normal. Speech and behavior are normal.  ____________________________________________   LABS (all labs  ordered are listed, but only abnormal results are displayed)  Labs Reviewed  COMPREHENSIVE METABOLIC PANEL - Abnormal; Notable for the following components:      Result Value   Glucose, Bld 137 (*)    Calcium 8.4 (*)    Albumin 3.4 (*)    All other components within normal limits  CBC - Abnormal; Notable for the following components:   WBC 11.9 (*)    All other components within normal limits  URINALYSIS, COMPLETE (UACMP) WITH MICROSCOPIC - Abnormal; Notable for the following components:   Color, Urine YELLOW (*)    APPearance CLOUDY (*)    Hgb urine dipstick LARGE (*)    Protein, ur 30 (*)    Nitrite POSITIVE (*)    Leukocytes, UA LARGE (*)    RBC / HPF >50 (*)    WBC, UA >50 (*)    Bacteria, UA MANY (*)    All other components within normal limits  URINE CULTURE  LIPASE, BLOOD  POC URINE PREG, ED  POCT PREGNANCY, URINE    PROCEDURES  Procedure(s) performed: None  Procedures  Critical Care performed: No  ____________________________________________   INITIAL IMPRESSION / ASSESSMENT AND PLAN / ED COURSE  As part of my medical decision making, I reviewed the following data within the electronic MEDICAL RECORD NUMBER Notes from prior ED visits and [place] Controlled Substance Database  Patient elected to have IM Rocephin rather than IV and did well while in the emergency department.  She was given a prescription for Bactrim DS twice daily for 10 days.  She is to increase fluids.  She is aware that she needs to return to the emergency department if any worsening of her symptoms over the weekend.  Urine culture was ordered.  ____________________________________________   FINAL CLINICAL IMPRESSION(S) / ED DIAGNOSES  Final diagnoses:  Acute UTI (urinary tract infection)     ED Discharge Orders        Ordered    sulfamethoxazole-trimethoprim (BACTRIM DS,SEPTRA DS) 800-160 MG tablet  2 times daily     06/26/18 1419       Note:  This document was prepared using Dragon  voice recognition software and may include unintentional dictation errors.    Tommi RumpsSummers, Lauretta Sallas L, PA-C 06/26/18 1525    Dionne BucySiadecki, Sebastian, MD 06/26/18 [id number]

## 2018-06-28 LAB — URINE CULTURE
Culture: >=100000 — AB
SPECIAL REQUESTS: NORMAL

## 2018-09-07 ENCOUNTER — Other Ambulatory Visit: Payer: Self-pay

## 2018-09-07 ENCOUNTER — Encounter: Payer: Self-pay | Admitting: Emergency Medicine

## 2018-09-07 DIAGNOSIS — M7989 Other specified soft tissue disorders: Secondary | ICD-10-CM | POA: Insufficient documentation

## 2018-09-07 DIAGNOSIS — M79662 Pain in left lower leg: Secondary | ICD-10-CM | POA: Insufficient documentation

## 2018-09-07 DIAGNOSIS — Z5321 Procedure and treatment not carried out due to patient leaving prior to being seen by health care provider: Secondary | ICD-10-CM | POA: Insufficient documentation

## 2018-09-07 NOTE — ED Triage Notes (Signed)
Patient coming in tonight for left leg pain and swelling. Patient said it has been hurting for the last couple of months but in the last few days feels like her left calf is swollen. 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

## 2018-09-08 ENCOUNTER — Emergency Department: Payer: Self-pay

## 2018-09-08 ENCOUNTER — Telehealth: Payer: Self-pay | Admitting: Emergency Medicine

## 2018-09-08 ENCOUNTER — Emergency Department
Admission: EM | Admit: 2018-09-08 | Discharge: 2018-09-08 | Disposition: A | Discharge status: Home / Self Care | Payer: Self-pay | Attending: Emergency Medicine | Admitting: Emergency Medicine

## 2018-09-08 DIAGNOSIS — M79662 Pain in left lower leg: Secondary | ICD-10-CM

## 2018-09-08 DIAGNOSIS — M7989 Other specified soft tissue disorders: Secondary | ICD-10-CM

## 2018-09-08 NOTE — Telephone Encounter (Signed)
Called patient due to lwot to inquire about condition and follow up plans. Left message.   

## 2018-09-09 ENCOUNTER — Emergency Department: Payer: Self-pay

## 2018-09-09 ENCOUNTER — Encounter: Payer: Self-pay | Admitting: Emergency Medicine

## 2018-09-09 ENCOUNTER — Emergency Department
Admission: EM | Admit: 2018-09-09 | Discharge: 2018-09-09 | Disposition: A | Discharge status: Home / Self Care | Payer: Self-pay | Attending: Emergency Medicine | Admitting: Emergency Medicine

## 2018-09-09 ENCOUNTER — Other Ambulatory Visit: Payer: Self-pay

## 2018-09-09 DIAGNOSIS — M7122 Synovial cyst of popliteal space [Baker], left knee: Secondary | ICD-10-CM | POA: Insufficient documentation

## 2018-09-09 DIAGNOSIS — F17210 Nicotine dependence, cigarettes, uncomplicated: Secondary | ICD-10-CM | POA: Insufficient documentation

## 2018-09-09 DIAGNOSIS — I509 Heart failure, unspecified: Secondary | ICD-10-CM | POA: Insufficient documentation

## 2018-09-09 DIAGNOSIS — I110 Hypertensive heart disease with heart failure: Secondary | ICD-10-CM | POA: Insufficient documentation

## 2018-09-09 MED ORDER — NAPROXEN 500 MG PO TABS: 500.0000 mg | ORAL_TABLET | Freq: Two times a day (BID) | ORAL | 0 refills | Status: DC

## 2018-09-09 NOTE — ED Triage Notes (Signed)
Pt states left leg and ankle pain that began a few months ago, denies injury to area, pt states swelling started a few days ago, does work a job on her feet all day, no swelling noted at this time.

## 2018-09-09 NOTE — Discharge Instructions (Signed)
Call make an appointment with Dr. Rosita Kea who is the orthopedist on call today.  Begin taking naproxen 500 mg twice daily with food every day.  You may use ice and elevate as needed for discomfort.

## 2018-09-09 NOTE — ED Provider Notes (Signed)
[facility] Emergency Department Provider Note  ____________________________________________   First MD Initiated Contact with Patient 09/09/18 1405     (approximate)  I have reviewed the triage vital signs and the nursing notes.   HISTORY  Chief Complaint Leg Pain and Ankle Pain   HPI Natasha Christian is a 32 y.o. female presents to the emergency department with complaint of left lower leg pain.  Patient states that she has had pain for approximately 1 month however for the last 2 days she has noticed swelling in her left lower leg which improves with elevation.  She denies any recent injury.  She states that walking increases her pain.  Patient currently uses birth control with an implant in her arm and continues to smoke cigarettes daily.  She denies any shortness of breath, nausea, vomiting, chest pain, fever or chills.  She has taken Tylenol over-the-counter with some minimal relief.  Currently she rates her pain as 7 out of 10.   Past Medical History:  Diagnosis Date  . CHF (congestive heart failure) (HCC)    After giving birth 2011, pt. has to return to hospital for "fluid around lungs and heart".   . Hypertension   . Pre-eclampsia     Patient Active Problem List   Diagnosis Date Noted  . No prenatal care in current pregnancy in second trimester 11/13/2015  . Abdominal pain affecting pregnancy, antepartum 11/13/2015  . Bacterial vaginosis 11/13/2015    Past Surgical History:  Procedure Laterality Date  . DILATION AND CURETTAGE OF UTERUS     elective AB    Prior to Admission medications   Medication Sig Start Date End Date Taking? Authorizing Provider  naproxen (NAPROSYN) 500 MG tablet Take 1 tablet (500 mg total) by mouth 2 (two) times daily with a meal. 09/09/18   Tommi Rumps, PA-C    Allergies Patient has no known allergies.  No family history on file.  Social History Social History   Tobacco Use  . Smoking status: Current  Every Day Smoker    Types: Cigarettes  . Smokeless tobacco: Never Used  Substance Use Topics  . Alcohol use: No  . Drug use: No    Review of Systems Constitutional: No fever/chills Cardiovascular: Denies chest pain. Respiratory: Denies shortness of breath. Gastrointestinal: No abdominal pain.  No nausea, no vomiting.  Musculoskeletal: Left lower leg pain and swelling positive. Skin: Negative for rash. Neurological: Negative for headaches, focal weakness or numbness. ____________________________________________   PHYSICAL EXAM:  VITAL SIGNS: ED Triage Vitals  Enc Vitals Group     BP 09/09/18 1341 (!) 123/56     Pulse Rate 09/09/18 1341 62     Resp 09/09/18 1341 16     Temp 09/09/18 1341 97.9 F (36.6 C)     Temp Source 09/09/18 1341 Oral     SpO2 09/09/18 1341 99 %     Weight 09/09/18 1342 210 lb (95.3 kg)     Height 09/09/18 1342 5\' 8"  (1.727 m)     Head Circumference --      Peak Flow --      Pain Score 09/09/18 1341 7     Pain Loc --      Pain Edu? --      Excl. in GC? --    Constitutional: Alert and oriented. Well appearing and in no acute distress. Eyes: Conjunctivae are normal.  Head: Atraumatic. Neck: No stridor.   Cardiovascular: Normal rate, regular rhythm. Grossly normal heart sounds.  Good peripheral circulation. Respiratory: Normal respiratory effort.  No retractions. Lungs CTAB. Musculoskeletal: Examination of left lower leg there is no gross deformity and no pitting edema present.  There is tenderness on palpation of the posterior calf area with no appreciated edema in comparison to the right.  No point tenderness is appreciated with palpation of the patella or effusion.  There is no discoloration of skin and no warmth is noted.  Pulses present.  Patient Homans sign is negative.  Range of motion is without restriction or crepitus.  Patient is able to bear weight and ambulate without assistance. Neurologic:  Normal speech and language. No gross focal  neurologic deficits are appreciated.  Skin:  Skin is warm, dry and intact. No rash noted. Psychiatric: Mood and affect are normal. Speech and behavior are normal.  ____________________________________________   LABS (all labs ordered are listed, but only abnormal results are displayed)  Labs Reviewed - No data to display  RADIOLOGY   Official radiology report(s): US Venous Img Lower Unilateral Left  Result Date: 09/09/2018 CLINICAL DATA:  Left lower extremity pain and swelling while walking. Patient is on birth control and tobacco smoker. EXAM: LEFT LOWER EXTREMITY VENOUS DUPLEX ULTRASOUND TECHNIQUE: Doppler venous assessment of the left lower extremity deep venous system was performed, including characterization of spectral flow, compressibility, and phasicity. COMPARISON:  None. FINDINGS: There is complete compressibility of the left common femoral, femoral, and popliteal veins. Doppler analysis demonstrates respiratory phasicity and augmentation of flow with calf compression. No obvious superficial vein or calf vein thrombosis. 4.2 x 1.2 x 2.6 cm oblong fluid collection is present in the popliteal fossa. IMPRESSION: No evidence of left lower extremity DVT. Left popliteal fossa Baker's cyst. Electronically Signed   By: Jolaine Click M.D.   On: 09/09/2018 15:34    ____________________________________________  PROCEDURES  Procedure(s) performed: None  Procedures  Critical Care performed: No  ____________________________________________   INITIAL IMPRESSION / ASSESSMENT AND PLAN / ED COURSE  As part of my medical decision making, I reviewed the following data within the electronic MEDICAL RECORD NUMBER Notes from prior ED visits and [place] Controlled Substance Database  32 year old female presents to the ED with complaint of left lower leg pain with swelling for the last 2 days.  Patient denies any injury but does continue to smoke with a birth control implant in her left arm.  Patient  denies any shortness of breath or prior DVTs.  She is taken some Tylenol over-the-counter without any relief.  Ultrasound ruled out a DVT but did show a Baker's cyst present.  Patient was made aware.  She was given a prescription for naproxen 500 mg twice daily with food for inflammation and also encouraged to make an appointment with Dr. Rosita Kea who is the orthopedist on call about her Baker's cyst.  Patient was given a note to return to work on 09/11/2018.  ____________________________________________   FINAL CLINICAL IMPRESSION(S) / ED DIAGNOSES  Final diagnoses:  Baker's cyst of knee, left     ED Discharge Orders         Ordered    naproxen (NAPROSYN) 500 MG tablet  2 times daily with meals     09/09/18 1601           Note:  This document was prepared using Dragon voice recognition software and may include unintentional dictation errors.    Tommi Rumps, PA-C 09/09/18 1644    Emily Filbert, MD 09/15/18 1146

## 2018-09-09 NOTE — ED Notes (Signed)
Pt states pain in her left ankle when she walks only, walked with limp to triage.

## 2018-09-09 NOTE — ED Notes (Signed)
See triage note. Presents with pain and swelling to left lower leg/ankle  States the swelling goes down with elevation  Denies any injury   Ambulates with slight limp

## 2018-09-25 ENCOUNTER — Other Ambulatory Visit: Payer: Self-pay | Admitting: Emergency Medicine

## 2019-11-09 NOTE — Telephone Encounter (Signed)
error 

## 2021-01-11 ENCOUNTER — Other Ambulatory Visit: Payer: Self-pay

## 2021-01-11 ENCOUNTER — Encounter: Payer: Self-pay | Admitting: Advanced Practice Midwife

## 2021-01-11 ENCOUNTER — Ambulatory Visit: Payer: Self-pay

## 2021-01-11 ENCOUNTER — Ambulatory Visit (LOCAL_COMMUNITY_HEALTH_CENTER): Payer: Medicaid Other | Admitting: Advanced Practice Midwife

## 2021-01-11 VITALS — BP 132/88 | Ht 68.0 in | Wt 205.0 lb

## 2021-01-11 DIAGNOSIS — O1490 Unspecified pre-eclampsia, unspecified trimester: Secondary | ICD-10-CM | POA: Insufficient documentation

## 2021-01-11 DIAGNOSIS — Z3200 Encounter for pregnancy test, result unknown: Secondary | ICD-10-CM

## 2021-01-11 DIAGNOSIS — F419 Anxiety disorder, unspecified: Secondary | ICD-10-CM

## 2021-01-11 DIAGNOSIS — Z30017 Encounter for initial prescription of implantable subdermal contraceptive: Secondary | ICD-10-CM

## 2021-01-11 DIAGNOSIS — J45909 Unspecified asthma, uncomplicated: Secondary | ICD-10-CM | POA: Insufficient documentation

## 2021-01-11 DIAGNOSIS — Z3009 Encounter for other general counseling and advice on contraception: Secondary | ICD-10-CM

## 2021-01-11 DIAGNOSIS — Z3202 Encounter for pregnancy test, result negative: Secondary | ICD-10-CM

## 2021-01-11 DIAGNOSIS — F17200 Nicotine dependence, unspecified, uncomplicated: Secondary | ICD-10-CM | POA: Insufficient documentation

## 2021-01-11 DIAGNOSIS — E669 Obesity, unspecified: Secondary | ICD-10-CM | POA: Insufficient documentation

## 2021-01-11 LAB — PREGNANCY, URINE: Preg Test, Ur: NEGATIVE

## 2021-01-11 LAB — WET PREP FOR TRICH, YEAST, CLUE
Trichomonas Exam: NEGATIVE
Yeast Exam: NEGATIVE

## 2021-01-11 MED ORDER — ETONOGESTREL 68 MG ~~LOC~~ IMPL
68.0000 mg | DRUG_IMPLANT | Freq: Once | SUBCUTANEOUS | Status: AC
Start: 2021-01-11 — End: 2021-01-11
  Administered 2021-01-11: 68 mg via SUBCUTANEOUS

## 2021-01-11 NOTE — Progress Notes (Signed)
[facility] [address] Main Number: [phone]    Family Planning Visit- Initial Visit  Subjective:  Natasha Christian is a 35 y.o. [id number] (17, 10, 8, 4) smoker   being seen today for an initial well woman visit and to discuss family planning options.  She is currently using Nexplanon for pregnancy prevention. Patient reports she does not want a pregnancy in the next year.  Patient has the following medical conditions has No prenatal care in current pregnancy in second trimester; Smoker 1ppd; Obesity BMI=31.1; Preeclampsia; Asthma; and Anxiety dx'd 2019 on their problem list.  Chief Complaint  Patient presents with  . Contraception    Physical and Nexplanon removal and reinsertion    Patient reports Nexplanon inserted 02/05/2016 and wants removal and reinsertion.  LMP 12/18/20.  Last sex 11/15/20 without condom; with current partner x 5 years; 1 partner in last 3 mo.  Last MJ 12/29/20.  Smoking 1 ppd.  Last ETOH 12/29/20 (1 bottle Tequila) 2x/mo.  Employed FT and living with her 3 children.  Unsure last pap maybe [facility]?   Patient denies problems  Body mass index is 31.17 kg/m. - Patient is eligible for diabetes screening based on BMI and age >83?  not applicable HA1C ordered? not applicable  Patient reports 1  partner/s in last year. Desires STI screening?  No - declines bloodwork  Has patient been screened once for HCV in the past?  No  No results found for: HCVAB  Does the patient have current drug use (including MJ), have a partner with drug use, and/or has been incarcerated since last result? Yes  If yes-- Screen for HCV through [facility]   Does the patient meet criteria for HBV testing? Yes  Criteria:  -Household, sexual or needle sharing contact with HBV -History of drug use -HIV positive -Those with known Hep C   Health Maintenance Due  Topic Date Due  . Hepatitis C Screening  Never  done  . COVID-19 Vaccine (1) Never done  . HIV Screening  Never done  . TETANUS/TDAP  Never done  . PAP SMEAR-Modifier  Never done  . INFLUENZA VACCINE  Never done    Review of Systems  Neurological: Positive for headaches (1x/mo relieved with Tylenol).  All other systems reviewed and are negative.   The following portions of the patient's history were reviewed and updated as appropriate: allergies, current medications, past family history, past medical history, past social history, past surgical history and problem list. Problem list updated.   See flowsheet for other program required questions.  Objective:   Vitals:   01/11/21 1400  BP: 132/88  Weight: 205 lb (93 kg)  Height: 5\' 8"  (1.727 m)    Physical Exam Constitutional:      Appearance: Normal appearance. She is obese.  HENT:     Head: Normocephalic and atraumatic.     Mouth/Throat:     Mouth: Mucous membranes are moist.  Eyes:     Conjunctiva/sclera: Conjunctivae normal.  Cardiovascular:     Rate and Rhythm: Normal rate and regular rhythm.  Pulmonary:     Effort: Pulmonary effort is normal.     Breath sounds: Normal breath sounds.  Chest:  Breasts:     Right: Normal.     Left: Normal.    Abdominal:     Palpations: Abdomen is soft.     Comments: Poor tone, soft without masses or tenderness, increased adipose  Genitourinary:  General: Normal vulva.     Exam position: Lithotomy position.     Vagina: Vaginal discharge (white creamy leukorrhea, ph<4.5) present.     Cervix: Normal.     Uterus: Normal.      Adnexa: Right adnexa normal and left adnexa normal.     Rectum: Normal.     Comments: Pap done Musculoskeletal:        General: Normal range of motion.     Cervical back: Normal range of motion and neck supple.  Skin:    General: Skin is warm and dry.  Neurological:     Mental Status: She is alert.  Psychiatric:        Mood and Affect: Mood normal.       Assessment and Plan:  Natasha Christian is a 35 y.o. female presenting to the [facility] Department for an initial well woman exam/family planning visit  Contraception counseling: Reviewed all forms of birth control options in the tiered based approach. available including abstinence; over the counter/barrier methods; hormonal contraceptive medication including pill, patch, ring, injection,contraceptive implant, ECP; hormonal and nonhormonal IUDs; permanent sterilization options including vasectomy and the various tubal sterilization modalities. Risks, benefits, and typical effectiveness rates were reviewed.  Questions were answered.  Written information was also given to the patient to review.  Patient desires Nexplanon removal and reinsertion, this was prescribed for patient. She will follow up in 1 year for surveillance.  She was told to call with any further questions, or with any concerns about this method of contraception.  Emphasized use of condoms 100% of the time for STI prevention.  Patient was not offered ECP.  ECP was not accepted by the patient. ECP counseling was not given - see RN documentation  1. Possible pregnancy PT neg today - Pregnancy, urine  2. Smoker Counseled via 5 A's to stop smoking.  3. Obesity, unspecified classification, unspecified obesity type, unspecified whether serious comorbidity present   4. Family planning ROI pap [facility]. Pt can't remember last pap Treat wet mount per standing orders Immunization nurse consult Please give primary care MD list to pt - WET PREP FOR TRICH, YEAST, CLUE - IGP, Aptima HPV - etonogestrel (NEXPLANON) implant 68 mg - Chlamydia/Gonorrhea [facility]  5. Encounter for initial prescription of implantable subdermal contraceptive Nexplanon Removal and Insertion  Patient identified, informed consent performed, consent signed.   Patient does understand that irregular bleeding is a very common side effect of this medication. She was  advised to have backup contraception for one week after replacement of the implant. Patient deemed to meet WHO criteria for being reasonably certain she is not pregnant.  Appropriate time out taken. Nexplanon site identified. Area prepped in usual sterile fashon. 3 ml of 1% lidocaine with epinephrine was used to anesthetize the area at the distal end of the implant. A small stab incision was made right beside the implant on the distal portion. The Nexplanon rod was grasped using hemostats and removed without difficulty. There was minimal blood loss. There were no complications.   Confirmed correct location of insertion site. The insertion site was identified 8-10 cm (3-4 inches) from the medial epicondyle of the humerus and 3-5 cm (1.25-2 inches) posterior to (below) the sulcus (groove) between the biceps and triceps muscles of the patient's left arm. New Nexplanon removed from packaging, Device confirmed in needle, then inserted full length of needle and withdrawn per handbook instructions. Nexplanon was able to palpated in the patient's left  arm; patient palpated the insert herself.  There was minimal blood loss. Patient insertion site covered with guaze and a pressure bandage to reduce any bruising. The patient tolerated the procedure well and was given post procedure instructions.   Nexplanon:   Counseled patient to take OTC analgesic starting as soon as lidocaine starts to wear off and take regularly for at least 48 hr to decrease discomfort.  Specifically to take with food or milk to decrease stomach upset and for IB 600 mg (3 tablets) every 6 hrs; IB 800 mg (4 tablets) every 8 hrs; or Aleve 2 tablets every 12 hrs.    6. Pre-eclampsia, antepartum x2   7. Uncomplicated asthma, unspecified asthma severity, unspecified whether persistent   8. Anxiety dx'd 2019      Return for yearly physical exam.  No future appointments.  Alberteen Spindle, CNM

## 2021-01-11 NOTE — Progress Notes (Signed)
Pt is here for physical, Nexplanon removal and reinsertion. Pt reports Nexplanon was placed at [facility] 02/05/2016. Pt reports only issue with Nexplanon is having headaches during periods. LMP 12/18/2020 and last sex was ~1 month ago per pt. RN counseling for Nexplanon removal and reinsertion completed, consult completed and consent forms reviewed and signed by pt. Pt states understanding of counseling.

## 2021-01-11 NOTE — Progress Notes (Addendum)
UPT is negative today and wet mount reviewed with provider and no treatment needed for wet mount per standing order and per E. Sciora, CNM verbal order. PCP list given to pt. Kathreen Cosier, LCSW card with contact info given to pt per pt request and provider verbal order. Upon posting pt reports that she had a pap smear at either [facility], or [facility], so ROI's for pap records obtained for [facility] and [facility], and ROI's reviewed and signed by pt per Hazle Coca, CNM verbal order. Pt declines condoms today. Counseled pt per provider orders and pt aware to either not have sex or use condoms every time for the next 7 days, and pt states understanding. Provider orders completed.  I agree with above note. Hazle Coca, CNM

## 2021-01-12 NOTE — Progress Notes (Signed)
ROI's faxed per Hazle Coca, CNM verbal order and fax confirmations received and sent to scan.

## 2021-01-16 ENCOUNTER — Encounter: Payer: Self-pay | Admitting: Advanced Practice Midwife

## 2021-01-16 DIAGNOSIS — R87619 Unspecified abnormal cytological findings in specimens from cervix uteri: Secondary | ICD-10-CM | POA: Insufficient documentation

## 2021-01-16 LAB — IGP, APTIMA HPV
HPV Aptima: POSITIVE — AB
PAP Smear Comment: 0

## 2021-01-18 ENCOUNTER — Telehealth: Payer: Self-pay

## 2021-01-18 ENCOUNTER — Telehealth: Payer: Self-pay | Admitting: *Deleted

## 2021-01-18 NOTE — Telephone Encounter (Signed)
Telephone call to patient today regarding her PAP results and the need for a [facility] referral.  Left a message to return my call.  Aydeen Blume, RN  

## 2021-01-18 NOTE — Telephone Encounter (Signed)
[facility] referring for [facility]. Called and left voicemail for patient to call back to be scheduled. At this time patient is showing active with Medicaid Family planning. Waiting to speak with patient prior to contacting [facility] for patient to be covered.

## 2021-01-18 NOTE — Telephone Encounter (Signed)
Return call by patient today.  Reviewed PAP results with patient (ASCUS and + HPV) and the need for a Colpo referral.  She reports having Medicaid and would like to go to [facility] for her Colpo.  Colpo referral completed today. Hart Carwin, RN

## 2021-01-19 ENCOUNTER — Encounter: Payer: Self-pay | Admitting: Physician Assistant

## 2021-01-19 NOTE — Progress Notes (Signed)
Received and reviewed ROI sent to [facility] where we asked for pap results.  Per response; "Not a patient at [facility]."

## 2021-01-24 ENCOUNTER — Ambulatory Visit: Payer: Self-pay | Attending: Oncology | Admitting: *Deleted

## 2021-01-24 ENCOUNTER — Encounter: Payer: Self-pay | Admitting: *Deleted

## 2021-01-24 VITALS — BP 129/97 | HR 100 | Temp 97.2°F | Ht 69.0 in | Wt 200.0 lb

## 2021-01-24 DIAGNOSIS — N63 Unspecified lump in unspecified breast: Secondary | ICD-10-CM

## 2021-01-24 NOTE — Patient Instructions (Signed)
Gave patient hand-out, Women Staying Healthy, Active and Well from [facility], with education on breast health, pap smears, heart and colon health. 

## 2021-01-24 NOTE — Progress Notes (Signed)
Subjective:     Patient ID: Natasha Christian, female   DOB: 1986/07/06, 35 y.o.   MRN: [id number]  HPI   [facility] Medical History Record - 01/23/21 1623      Breast History   Screening cycle New    CBE Date 01/11/21    Provider (CBE) [facility]    Last Mammogram Never    Recent Breast Symptoms Lump;Pain   left breast mass and pain     Breast Cancer History   Breast Cancer History No personal or family history    Comments/Details maternal aunt had breast cancer      Previous History of Breast Problems   Breast Surgery or Biopsy None    Breast Implants N/A    BSE Done Monthly      Gynecological/Obstetrical History   LMP 01/23/21    Is there any chance that the client could be pregnant?  No    Age at menarche 44    Age at menopause na    PAP smear history Annually    Date of last PAP  01/11/21    Provider (PAP) [facility]    Age at first live birth 36    Breast fed children No    DES Exposure No    Cervical, Uterine or Ovarian cancer No    Family history of Cervial, Uterine or Ovarian cancer No    Hysterectomy No    Cervix removed No    Ovaries removed No    Laser/Cryosurgery No    Current method of birth control Other (see comments)   Nexplanon   Current method of Estrogen/Hormone replacement None    Smoking history Yes    Comments refused info on smoking cessation             Review of Systems     Objective:   Physical Exam Chest:  Breasts:     Right: Inverted nipple present. No swelling, bleeding, mass, nipple discharge, skin change, tenderness, axillary adenopathy or supraclavicular adenopathy.     Left: Inverted nipple, mass and tenderness present. No swelling, bleeding, nipple discharge, skin change, axillary adenopathy or supraclavicular adenopathy.     Lymphadenopathy:     Upper Body:     Right upper body: No supraclavicular or axillary adenopathy.     Left upper body: No supraclavicular or axillary adenopathy.        Assessment:     35 year old Black  female referred to [facility] for financial assistance for further evaluation of recent abnormal pap of HPV positive ASCUS on 01/11/21.  While doing the interview of her health history, patient complains of intermittent left breast mass and pain..  States she experiences severe pain, rating a 10 on a 0/10 scale.  States it has been intermittent over the last 6 months, but when it is present the pain is constant for about 1-2 weeks, then the lump and pain go away. States her bra makes it worse.  She does use Tylenol for relief.  On clinical breast exam I can palpate an approximate 1.5 cm superficial nodule at about 7-8:00 in the medial breast, just along the fold line.    It is very sebaceous cyst like in nature.  Taught self breast awareness.  Patient has been screened for eligibility.  She does not have any insurance, Medicare or Medicaid.  She also meets financial eligibility.    Plan:     Will get bilateral diagnostic mammogram and ultrasound.  Will send message for [employer]  to schedule patients mammogram. If imaging findings are consistent with a sebaceous cyst, I will refer patient for a surgical consult for futher evaluation for her pain.  Patient is agreeable to the plan.

## 2021-02-01 ENCOUNTER — Other Ambulatory Visit: Payer: Self-pay

## 2021-02-01 ENCOUNTER — Ambulatory Visit
Admission: RE | Admit: 2021-02-01 | Discharge: 2021-02-01 | Disposition: A | Discharge status: Home / Self Care | Payer: Self-pay | Source: Ambulatory Visit | Attending: Oncology | Admitting: Oncology

## 2021-02-01 DIAGNOSIS — N63 Unspecified lump in unspecified breast: Secondary | ICD-10-CM

## 2021-02-02 ENCOUNTER — Ambulatory Visit (INDEPENDENT_AMBULATORY_CARE_PROVIDER_SITE_OTHER): Payer: Self-pay | Admitting: Obstetrics and Gynecology

## 2021-02-02 ENCOUNTER — Encounter: Payer: Self-pay | Admitting: Obstetrics and Gynecology

## 2021-02-02 ENCOUNTER — Other Ambulatory Visit (HOSPITAL_COMMUNITY)
Admission: RE | Admit: 2021-02-02 | Discharge: 2021-02-02 | Disposition: A | Discharge status: Home / Self Care | Payer: Medicaid Other | Source: Ambulatory Visit | Attending: Obstetrics and Gynecology | Admitting: Obstetrics and Gynecology

## 2021-02-02 ENCOUNTER — Other Ambulatory Visit: Payer: Self-pay

## 2021-02-02 VITALS — BP 116/70 | Ht 68.0 in | Wt 199.4 lb

## 2021-02-02 DIAGNOSIS — R87810 Cervical high risk human papillomavirus (HPV) DNA test positive: Secondary | ICD-10-CM | POA: Diagnosis present

## 2021-02-02 DIAGNOSIS — R87610 Atypical squamous cells of undetermined significance on cytologic smear of cervix (ASC-US): Secondary | ICD-10-CM | POA: Insufficient documentation

## 2021-02-02 DIAGNOSIS — Z0189 Encounter for other specified special examinations: Secondary | ICD-10-CM | POA: Insufficient documentation

## 2021-02-02 NOTE — Progress Notes (Signed)
   GYNECOLOGY CLINIC COLPOSCOPY PROCEDURE NOTE  35 y.o. [id number] here for colposcopy for ASCUS with POSITIVE high risk HPV  pap smear on 01/11/2021. Discussed underlying role for HPV infection in the development of cervical dysplasia, its natural history and progression/regression, need for surveillance.  Is the patient  pregnant: No LMP: No LMP recorded. Patient has had an implant. Smoking status:  reports that she has been smoking cigarettes. She has never used smokeless tobacco. Contraception: Nexplanon Future fertility desired:  Yes  Patient given informed consent, signed copy in the chart, time out was performed.  The patient was position in dorsal lithotomy position. Speculum was placed the cervix was visualized.   After application of acetic acid colposcopic inspection of the cervix was undertaken.   Colposcopy adequate, full visualization of transformation zone: Yes acetowhite lesion(s) noted at 12 and 6 o'clock; corresponding biopsies obtained.   ECC specimen obtained:  Yes  All specimens were labeled and sent to pathology.   Patient was given post procedure instructions.  Will follow up pathology and manage accordingly.  Routine preventative health maintenance measures emphasized.  Physical Exam Genitourinary:        Adelene Idler MD [facility], [facility] 02/02/2021 3:16 PM

## 2021-02-02 NOTE — Patient Instructions (Signed)

## 2021-02-06 ENCOUNTER — Encounter: Payer: Self-pay | Admitting: Obstetrics and Gynecology

## 2021-02-06 LAB — SURGICAL PATHOLOGY

## 2021-02-19 ENCOUNTER — Encounter: Payer: Self-pay | Admitting: *Deleted

## 2021-02-19 NOTE — Progress Notes (Signed)
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  She is to come by tomorrow to sign paperwork.

## 2021-02-20 ENCOUNTER — Encounter: Payer: Self-pay | Admitting: *Deleted

## 2021-02-20 NOTE — Progress Notes (Signed)
Patient came in today to complete her [facility] Medicaid application.  Message sent to Dr. Jerene Pitch and Marella Bile at [facility] that we were completing her Medicaid application and to schedule patient's LEEP after February 27, 2021.  Will have Dr. Orlie Dakin sign forms tomorrow when he is back in the office.

## 2021-03-06 ENCOUNTER — Telehealth: Payer: Self-pay

## 2021-03-06 NOTE — Telephone Encounter (Signed)
Called patient to schedule cervical conization w [facility]  DOS 3/31  H&P 3/23 @ 10:50    Covid testing  @ , [facility] [employer], drive up and wear mask. Advised pt to quarantine until DOS.  Pre-admit phone call appointment to be requested - date and time will be included on H&P paper work. Also all appointments will be updated on pt MyChart. Explained that this appointment has a call window. Based on the time scheduled will indicate if the call will be received within a 4 hour window before 1:00 or after.  Advised that pt may also receive calls from the hospital pharmacy and pre-service center.  Confirmed pt has [employer] as Editor, commissioning. No secondary insurance.

## 2021-03-06 NOTE — Telephone Encounter (Signed)
-----   Message from Natale Milch, MD sent at 02/20/2021 12:18 PM EST ----- Yes please ----- Message ----- From: Lilia Pro H Sent: 02/15/2021   4:08 PM EST To: Natale Milch, MD  Pt has Medicaid Family Planning. Do you want to see if [facility] will help assist?   ----- Message ----- From: Natale Milch, MD Sent: 02/06/2021   3:05 PM EST To: Natasha Christian [facility]  Surgery Booking Request Patient Full Name:  Natasha Christian  MRN: [id number]  DOB: 1986/05/06  Surgeon: Natale Milch, MD  Requested Surgery Date and Time: next 1-3 months Primary Diagnosis AND Code: CIN 3 Secondary Diagnosis and Code:  Surgical Procedure: Cervical conization RNFA Requested?: No L&D Notification: No Admission Status: same day surgery Length of Surgery: 50 min Special Case Needs: No H&P: Yes Phone Interview???:  Yes Interpreter: No Medical Clearance:  No Special Scheduling Instructions: No Any known health/anesthesia issues, diabetes, sleep apnea, latex allergy, defibrillator/pacemaker?: No Acuity: P2   (P1 highest, P2 delay may cause harm, P3 low, elective gyn, P4 lowest)

## 2021-03-08 NOTE — Telephone Encounter (Signed)
Orders placed.

## 2021-03-21 ENCOUNTER — Encounter: Payer: Self-pay | Admitting: Obstetrics and Gynecology

## 2021-03-21 ENCOUNTER — Ambulatory Visit (INDEPENDENT_AMBULATORY_CARE_PROVIDER_SITE_OTHER): Payer: Medicaid Other | Admitting: Obstetrics and Gynecology

## 2021-03-21 ENCOUNTER — Other Ambulatory Visit: Payer: Self-pay

## 2021-03-21 VITALS — BP 126/70 | Ht 68.0 in | Wt 200.4 lb

## 2021-03-21 DIAGNOSIS — D069 Carcinoma in situ of cervix, unspecified: Secondary | ICD-10-CM | POA: Diagnosis not present

## 2021-03-21 NOTE — H&P (View-Only) (Signed)
Patient ID: Natasha Christian, female   DOB: 1986-02-10, 35 y.o.   MRN: [id number]  Reason for Consult: Gynecologic Exam   Referred by Alberteen Spindle, CNM  Subjective:     HPI:  Natasha Christian is a 35 y.o. female.  She is here today for preoperative visit.  She has no new complaints.  She underwent colposcopy previously which showed CIN-3.  She has been counseled regarding her options for LEEP or Cervical conization.  Her preference for procedure cervical conization and this is planned for next month.  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  Past Medical History:  Diagnosis Date  . Asthma   . CHF (congestive heart failure) (HCC)    After giving birth 2011, pt. has to return to hospital for "fluid around lungs and heart".   . Hypertension   . Pre-eclampsia    Family History  Problem Relation Age of Onset  . Diabetes Maternal Grandmother   . Diabetes Mother   . Lung cancer Mother   . Pancreatic cancer Maternal Uncle   . Breast cancer Maternal Aunt    Past Surgical History:  Procedure Laterality Date  . DILATION AND CURETTAGE OF UTERUS     elective AB    Short Social History:  Social History   Tobacco Use  . Smoking status: Current Every Day Smoker    Types: Cigarettes  . Smokeless tobacco: Never Used  Substance Use Topics  . Alcohol use: No    No Known Allergies  Current Outpatient Medications  Medication Sig Dispense Refill  . acetaminophen (TYLENOL) 500 MG tablet Take 1,000 mg by mouth every 6 (six) hours as needed for moderate pain.     No current facility-administered medications for this visit.    Review of Systems  Constitutional: Negative for chills, fatigue, fever and unexpected weight change.  HENT: Negative for trouble swallowing.  Eyes: Negative for loss of vision.  Respiratory: Negative for cough, shortness of breath and wheezing.   Cardiovascular: Negative for chest pain, leg swelling, palpitations and syncope.  GI: Negative for abdominal pain, blood in stool, diarrhea, nausea and vomiting.  GU: Negative for difficulty urinating, dysuria, frequency and hematuria.  Musculoskeletal: Negative for back pain, leg pain and joint pain.  Skin: Negative for rash.  Neurological: Negative for dizziness, headaches, light-headedness, numbness and seizures.  Psychiatric: Negative for behavioral problem, confusion, depressed mood and sleep disturbance.        Objective:  Objective   Vitals:   03/21/21 1115  BP: 126/70  Weight: 200 lb 6.4 oz (90.9 kg)  Height: 5\' 8"  (1.727 m)   Body mass index is 30.47 kg/m.  Physical Exam Vitals and nursing note reviewed. Exam conducted with a chaperone present.  Constitutional:      Appearance: Normal appearance.  HENT:     Head: Normocephalic and atraumatic.  Eyes:     Extraocular Movements: Extraocular movements intact.     Pupils: Pupils are equal, round, and reactive to light.  Cardiovascular:     Rate and Rhythm: Normal rate and regular rhythm.  Pulmonary:     Effort: Pulmonary effort is normal.     Breath sounds: Normal breath sounds.  Abdominal:     General: Abdomen is flat.     Palpations: Abdomen is soft.  Musculoskeletal:     Cervical back: Normal range of motion.  Skin:    General: Skin is warm  and dry.  Neurological:     General: No focal deficit present.     Mental Status: She is alert and oriented to person, place, and time.  Psychiatric:        Behavior: Behavior normal.        Thought Content: Thought content normal.        Judgment: Judgment normal.     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  Patient desires to continue with cervical conization procedure.  She understands normal recovery such as 4 weeks without anything in the vagina and thin watery discharge with small  amounts of vaginal spotting.  She understands that she will need to refrain from intercourse and use of tampons after the surgery until her cervix is fully healed.  She understands that risks associated with this pregnancy include risk of a short cervix possible preterm birth with any subsequent pregnancies.  She reports that she no longer desires to conceive or carry a pregnancy.  She understands the risks of infection and damage to surrounding tissues including bowel bladder uterus and cervix.  We discussed normal follow-up after a colonoscopy and plans for continued cervical surveillance. All questions were answered and consents were signed.  More than 15 minutes were spent face to face with the patient in the room, reviewing the medical record, labs and images, and coordinating care for the patient. The plan of management was discussed in detail and counseling was provided.    Adelene Idler MD [facility], [facility] 03/21/2021 11:28 AM

## 2021-03-21 NOTE — Progress Notes (Signed)
 Patient ID: Natasha Christian, female   DOB: 08/30/1986, 35 y.o.   MRN: [id number]  Reason for Consult: Gynecologic Exam   Referred by Sciora, Elizabeth A, CNM  Subjective:     HPI:  Natasha Christian is a 35 y.o. female.  She is here today for preoperative visit.  She has no new complaints.  She underwent colposcopy previously which showed CIN-3.  She has been counseled regarding her options for LEEP or Cervical conization.  Her preference for procedure cervical conization and this is planned for next month.  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  Past Medical History:  Diagnosis Date  . Asthma   . CHF (congestive heart failure) (HCC)    After giving birth 2011, pt. has to return to hospital for "fluid around lungs and heart".   . Hypertension   . Pre-eclampsia    Family History  Problem Relation Age of Onset  . Diabetes Maternal Grandmother   . Diabetes Mother   . Lung cancer Mother   . Pancreatic cancer Maternal Uncle   . Breast cancer Maternal Aunt    Past Surgical History:  Procedure Laterality Date  . DILATION AND CURETTAGE OF UTERUS     elective AB    Short Social History:  Social History   Tobacco Use  . Smoking status: Current Every Day Smoker    Types: Cigarettes  . Smokeless tobacco: Never Used  Substance Use Topics  . Alcohol use: No    No Known Allergies  Current Outpatient Medications  Medication Sig Dispense Refill  . acetaminophen (TYLENOL) 500 MG tablet Take 1,000 mg by mouth every 6 (six) hours as needed for moderate pain.     No current facility-administered medications for this visit.    Review of Systems  Constitutional: Negative for chills, fatigue, fever and unexpected weight change.  HENT: Negative for trouble swallowing.  Eyes: Negative for loss of vision.  Respiratory: Negative for cough, shortness of breath and wheezing.   Cardiovascular: Negative for chest pain, leg swelling, palpitations and syncope.  GI: Negative for abdominal pain, blood in stool, diarrhea, nausea and vomiting.  GU: Negative for difficulty urinating, dysuria, frequency and hematuria.  Musculoskeletal: Negative for back pain, leg pain and joint pain.  Skin: Negative for rash.  Neurological: Negative for dizziness, headaches, light-headedness, numbness and seizures.  Psychiatric: Negative for behavioral problem, confusion, depressed mood and sleep disturbance.        Objective:  Objective   Vitals:   03/21/21 1115  BP: 126/70  Weight: 200 lb 6.4 oz (90.9 kg)  Height: 5' 8" (1.727 m)   Body mass index is 30.47 kg/m.  Physical Exam Vitals and nursing note reviewed. Exam conducted with a chaperone present.  Constitutional:      Appearance: Normal appearance.  HENT:     Head: Normocephalic and atraumatic.  Eyes:     Extraocular Movements: Extraocular movements intact.     Pupils: Pupils are equal, round, and reactive to light.  Cardiovascular:     Rate and Rhythm: Normal rate and regular rhythm.  Pulmonary:     Effort: Pulmonary effort is normal.     Breath sounds: Normal breath sounds.  Abdominal:     General: Abdomen is flat.     Palpations: Abdomen is soft.  Musculoskeletal:     Cervical back: Normal range of motion.  Skin:    General: Skin is warm   and dry.  Neurological:     General: No focal deficit present.     Mental Status: She is alert and oriented to person, place, and time.  Psychiatric:        Behavior: Behavior normal.        Thought Content: Thought content normal.        Judgment: Judgment normal.     Assessment/Plan:     35-year-old with CIN-3 Have discussed in detail her options for cervical conization versus LEEP.  Patient desires to continue with cervical conization procedure.  She understands normal recovery such as 4 weeks without anything in the vagina and thin watery discharge with small  amounts of vaginal spotting.  She understands that she will need to refrain from intercourse and use of tampons after the surgery until her cervix is fully healed.  She understands that risks associated with this pregnancy include risk of a short cervix possible preterm birth with any subsequent pregnancies.  She reports that she no longer desires to conceive or carry a pregnancy.  She understands the risks of infection and damage to surrounding tissues including bowel bladder uterus and cervix.  We discussed normal follow-up after a colonoscopy and plans for continued cervical surveillance. All questions were answered and consents were signed.  More than 15 minutes were spent face to face with the patient in the room, reviewing the medical record, labs and images, and coordinating care for the patient. The plan of management was discussed in detail and counseling was provided.    Brittney Caraway MD [facility], [facility] 03/21/2021 11:28 AM   

## 2021-03-21 NOTE — Patient Instructions (Signed)
Cervical Conization Cervical conization (cone biopsy) is a procedure in which a cone-shaped portion of the cervix is cut out so that it can be looked at under a microscope. The cervix is the lowest part of the uterus. This procedure is done to check for cancer cells or cells that might turn into cancer (precancerous cells). You may have this procedure if:  You have abnormal bleeding from your cervix.  You had an abnormal Pap test.  Something abnormal was seen on your cervix during an exam. This procedure is performed in either a health care provider's office or in an operating room. Tell a health care provider about:  Any allergies you have.  All medicines you are taking, including vitamins, herbs, eye drops, creams, and over-the-counter medicines.  Any problems you or family members have had with anesthetic medicines.  Any blood disorders you have.  Any surgeries you have had.  Any medical conditions you have.  Your use of nicotine or tobacco products.  When you normally have your menstrual period.  Whether you are pregnant or may be pregnant. What are the risks? Generally, this is a safe procedure. However, problems may occur, including:  Heavy bleeding for several days or weeks after the procedure.  Allergic reactions to medicines or to dyes or other solutions.  Increased risk of early (preterm) labor in future pregnancies.  Infection. This is rare.  Damage to the cervix or nearby structures or organs. This is rare. What happens before the procedure? Staying hydrated Follow instructions from your health care provider about hydration, which may include:  Up to 2 hours before the procedure - you may continue to drink clear liquids, such as water, clear fruit juice, black coffee, and plain tea. Eating and drinking restrictions Follow instructions from your health care provider about eating and drinking, which may include:  8 hours before the procedure - stop eating  heavy meals or foods, such as meat, fried foods, or fatty foods.  6 hours before the procedure - stop eating light meals or foods, such as toast or cereal.  6 hours before the procedure - stop drinking milk or drinks that contain milk.  2 hours before the procedure - stop drinking clear liquids. Medicines Ask your health care provider about:  Changing or stopping your regular medicines. This is especially important if you are taking diabetes medicines or blood thinners.  Taking medicines such as aspirin and ibuprofen. These medicines can thin your blood. Do not take these medicines unless your health care provider tells you to take them.  Taking over-the-counter medicines, vitamins, herbs, and supplements. General instructions  If told by your health care provider, do not put anything in the vagina before the procedure. Do not douche, have sex, use tampons, or use any vaginal medicines before the procedure.  Ask your health care provider: ? How your surgery site will be marked. ? What steps will be taken to help prevent infection. These may include:  Removing hair at the surgery site.  Washing skin with a germ-killing soap.  Taking antibiotic medicine.  Plan to have someone take you home from the hospital or clinic.  If you will be going home right after the procedure, plan to have someone with you for 24 hours.  You may be asked to empty your bladder and bowel right before the procedure. What happens during the procedure?  You will lie on an examining table and put your feet in footrests (stirrups).  An IV will be inserted into  one of your veins.  You will be given one or more of the following: ? A medicine to help you relax (sedative). ? A medicine to numb the area (local anesthetic). ? A medicine to make you fall asleep (general anesthetic). ? A medicine that numbs the cervix (cervical block).  A lubricated device called a speculum will be inserted into your vagina.  It will be used to spread open the walls of the vagina so your health care provider can better see the inside of the vagina and cervix.  An instrument that has a magnifying lens and a light (colposcope) will let your health care provider examine the cervix more closely.  Your health care provider will apply a solution to your cervix. This turns abnormal areas a pale color.  A tissue sample will be removed from the cervix using one of the following methods: ? The cold knife method. The tissue is cut out with a knife (scalpel). ? The loop electrosurgical excision procedure (LEEP) method. The tissue is cut out with a thin wire that can burn (cauterize) the tissue with an electrical current. ? Laser treatment method. The tissue is cut out and then cauterized with a laser beam to prevent bleeding.  Your health care provider will apply a paste over the biopsy areas to help control bleeding.  The tissue sample will be checked under a microscope. The procedure may vary among health care providers and hospitals.      What happens after the procedure?  Your blood pressure, heart rate, breathing rate, and blood oxygen level will be monitored until you leave the hospital or clinic.  If you were given a local anesthetic, you will rest at the clinic or hospital until you are stable and ready to go home.  If you were given a general anesthetic, you may be monitored for a longer period of time.  You may have some cramping.  You may have bloody discharge or light to moderate bleeding.  You may have dark discharge coming from your vagina. This is from the paste used on the cervix to prevent bleeding. Summary  Cervical conization is a procedure in which a cone-shaped portion of the cervix is cut out so that it can be examined under a microscope.  This procedure is done to check for cancer cells or cells that might turn into cancer (precancerous cells).  After the procedure, you may have bloody  discharge or light to moderate bleeding. You may also have dark discharge from the paste that was used on the cervix to prevent bleeding. This information is not intended to replace advice given to you by your health care provider. Make sure you discuss any questions you have with your health care provider. Document Revised: 06/14/2019 Document Reviewed: 06/14/2019 Elsevier Patient Education  2021 [employer] Cervical Conization, Care After This sheet gives you information about how to care for yourself after your procedure. Your health care provider may also give you more specific instructions. If you have problems or questions, contact your health care provider. What can I expect after the procedure? After the procedure, it is common to have:  A groggy feeling, if you were given medicine to make you fall asleep (general anesthetic).  Cramps that feel similar to menstrual cramps.  Bloody discharge or light to moderate bleeding.  Dark discharge. This discharge may look similar to coffee grounds. This is from the paste that was applied to the cervix to control bleeding. Follow these instructions at home: Medicines  Take over-the-counter and prescription medicines only as told by your health care provider.  Do not take aspirin until your health care provider says it is okay. Aspirin can cause bleeding. Activity  Rest as told by your health care provider.  Avoid activities that require great effort, such as exercises and heavy lifting, for at least 7-14 days.  Do not lift anything that is heavier than 10 lb (4.5 kg), or the limit that you are told, until your health care provider says that it is safe.  Return to your normal activities as told by your health care provider. Ask your health care provider what activities are safe for you.   General instructions  You may resume your normal diet unless your health care provider advises you not to do so.  Drink enough fluid to keep your  urine pale yellow.  Do not take baths, swim, or use a hot tub until your health care provider approves. Ask your health care provider if you may take showers. You may only be allowed to take sponge baths.  Do not douche, have sex, or put anything in the vagina, including tampons, until your health care provider says it is okay.  Keep all follow-up visits as told by your health care provider. This is important.   Contact a health care provider if:  You develop a rash.  You are dizzy or light-headed.  You feel nauseous or you vomit.  You develop a bad-smelling discharge from your vagina. Get help right away if:  You have blood clots or bleeding that is heavier than a normal menstrual period. Bleeding that soaks a pad in less than 1 hour is considered heavy bleeding.  You have a fever.  You have increasing cramps.  You faint.  You have pain when you urinate.  You have severe or worsening pain that is not relieved when you take medicine.  You have bloody urine. Summary  After the procedure, it is common to have cramps, some bleeding, and dark or bloody discharge from your vagina.  Do not douche, have sex, or put anything in the vagina, including tampons, until your health care provider says it is okay.  Avoid activities that require great effort, such as exercises and heavy lifting, for at least 7-14 days.  Follow all other activity restrictions as told by your health care provider. This information is not intended to replace advice given to you by your health care provider. Make sure you discuss any questions you have with your health care provider. Document Revised: 06/14/2019 Document Reviewed: 06/14/2019 Elsevier Patient Education  2021 [employer].

## 2021-03-22 ENCOUNTER — Encounter
Admission: RE | Admit: 2021-03-22 | Discharge: 2021-03-22 | Disposition: A | Discharge status: Home / Self Care | Payer: Medicaid Other | Source: Ambulatory Visit | Attending: Cardiology | Admitting: Cardiology

## 2021-03-22 HISTORY — DX: Gastro-esophageal reflux disease without esophagitis: K21.9

## 2021-03-22 NOTE — Patient Instructions (Signed)
Your procedure is scheduled on: Thursday 03/29/21.  Report to THE FIRST FLOOR REGISTRATION DESK IN THE MEDICAL MALL ON THE MORNING OF SURGERY FIRST, THEN YOU WILL CHECK IN AT THE SURGERY INFORMATION DESK LOCATED OUTSIDE THE SAME DAY SURGERY DEPARTMENT LOCATED ON 2ND FLOOR MEDICAL MALL ENTRANCE.  To find out your arrival time please call [phone] between 1PM - 3PM on Wednesday 03/28/21.   Remember: Instructions that are not followed completely may result in serious medical risk, up to and including death, or upon the discretion of your surgeon and anesthesiologist your surgery may need to be rescheduled.     __X__ 1. Do not eat food after midnight the night before your procedure.                 No gum chewing or hard candies. You may drink clear liquids up to 2 hours                 before you are scheduled to arrive for your surgery- DO NOT drink clear                 liquids within 2 hours of the start of your surgery.                 Clear Liquids include:  water, apple juice without pulp, clear carbohydrate                 drink such as Clearfast or Gatorade, Black Coffee or Tea (Do not add                 milk or creamer to coffee or tea).  __X__2.  On the morning of surgery brush your teeth with toothpaste and water, you may rinse your mouth with mouthwash if you wish.  Do not swallow any toothpaste or mouthwash.    __X__ 3.  No Alcohol for 24 hours before or after surgery.  __X__ 4.  Do Not Smoke or use e-cigarettes For 24 Hours Prior to Your Surgery.                 Do not use any chewable tobacco products for at least 6 hours prior to                 surgery.  __X__5.  Notify your doctor if there is any change in your medical condition      (cold, fever, infections).      Do NOT wear jewelry, make-up, hairpins, clips or nail polish. Do NOT wear lotions, powders, or perfumes.  Do NOT shave 48 hours prior to surgery. Men may shave face and neck. Do NOT bring valuables to  the hospital.     [facility] is not responsible for any belongings or valuables.   Contacts, dentures/partials or body piercings may not be worn into surgery. Bring a case for your contacts, glasses or hearing aids, a denture cup will be supplied.    Patients discharged the day of surgery will not be allowed to drive home.     __X__ Take these medicines the morning of surgery with A SIP OF WATER:     1. None    __X__ Shower the morning of surgery before you arrive to the hospital.  __X__ Stop Anti-inflammatories 7 days before surgery such as Advil, Ibuprofen, Motrin, BC or Goodies Powder, Naprosyn, Naproxen, Aleve, Aspirin, Meloxicam. May take Tylenol if needed for pain or discomfort.   __X__Do not start taking any  new herbal supplements or vitamins prior to your procedure.    Wear comfortable clothing (specific to your surgery type) to the hospital.  Plan for stool softeners for home use; pain medications have a tendency to cause constipation. You can also help prevent constipation by eating foods high in fiber such as fruits and vegetables and drinking plenty of fluids as your diet allows.  After surgery, you can prevent lung complications by doing breathing exercises.Take deep breaths and cough every 1-2 hours. Your doctor may order a device called an Incentive Spirometer to help you take deep breaths.  Please call the Pre-Admissions Testing Department at [phone] if you have any questions about these instructions

## 2021-03-27 ENCOUNTER — Other Ambulatory Visit: Payer: Self-pay

## 2021-03-27 ENCOUNTER — Other Ambulatory Visit
Admission: RE | Admit: 2021-03-27 | Discharge: 2021-03-27 | Disposition: A | Discharge status: Home / Self Care | Payer: Medicaid Other | Source: Ambulatory Visit | Attending: Obstetrics and Gynecology | Admitting: Obstetrics and Gynecology

## 2021-03-27 DIAGNOSIS — Z01812 Encounter for preprocedural laboratory examination: Secondary | ICD-10-CM | POA: Diagnosis present

## 2021-03-27 DIAGNOSIS — Z20822 Contact with and (suspected) exposure to covid-19: Secondary | ICD-10-CM | POA: Insufficient documentation

## 2021-03-27 LAB — CBC
HCT: 36.5 % (ref 36.0–46.0)
Hemoglobin: 11.9 g/dL — ABNORMAL LOW (ref 12.0–15.0)
MCH: 28.2 pg (ref 26.0–34.0)
MCHC: 32.6 g/dL (ref 30.0–36.0)
MCV: 86.5 fL (ref 80.0–100.0)
Platelets: 304 10*3/uL (ref 150–400)
RBC: 4.22 MIL/uL (ref 3.87–5.11)
RDW: 13.2 % (ref 11.5–15.5)
WBC: 4.8 10*3/uL (ref 4.0–10.5)
nRBC: 0 % (ref 0.0–0.2)

## 2021-03-27 LAB — TYPE AND SCREEN
ABO/RH(D): O NEG
Antibody Screen: NEGATIVE

## 2021-03-27 LAB — SARS CORONAVIRUS 2 (TAT 6-24 HRS): SARS Coronavirus 2: NEGATIVE

## 2021-03-28 MED ORDER — ORAL CARE MOUTH RINSE: 15.0000 mL | Freq: Once | OROMUCOSAL | Status: AC

## 2021-03-28 MED ORDER — LACTATED RINGERS IV SOLN: INTRAVENOUS | Status: DC

## 2021-03-28 MED ORDER — POVIDONE-IODINE 10 % EX SWAB: 2.0000 "application " | Freq: Once | CUTANEOUS | Status: DC

## 2021-03-28 MED ORDER — FAMOTIDINE 20 MG PO TABS: 20.0000 mg | ORAL_TABLET | Freq: Once | ORAL | Status: AC

## 2021-03-28 MED ORDER — CHLORHEXIDINE GLUCONATE 0.12 % MT SOLN: 15.0000 mL | Freq: Once | OROMUCOSAL | Status: AC

## 2021-03-29 ENCOUNTER — Ambulatory Visit
Admission: RE | Admit: 2021-03-29 | Discharge: 2021-03-29 | Disposition: A | Discharge status: Home / Self Care | Payer: Medicaid Other | Attending: Obstetrics and Gynecology | Admitting: Obstetrics and Gynecology

## 2021-03-29 ENCOUNTER — Ambulatory Visit: Payer: Medicaid Other | Admitting: Certified Registered Nurse Anesthetist

## 2021-03-29 ENCOUNTER — Encounter
Admission: RE | Disposition: A | Discharge status: Home / Self Care | Payer: Self-pay | Source: Home / Self Care | Attending: Obstetrics and Gynecology

## 2021-03-29 ENCOUNTER — Encounter: Payer: Self-pay | Admitting: Obstetrics and Gynecology

## 2021-03-29 ENCOUNTER — Other Ambulatory Visit: Payer: Self-pay

## 2021-03-29 DIAGNOSIS — Z8759 Personal history of other complications of pregnancy, childbirth and the puerperium: Secondary | ICD-10-CM | POA: Diagnosis not present

## 2021-03-29 DIAGNOSIS — F17200 Nicotine dependence, unspecified, uncomplicated: Secondary | ICD-10-CM | POA: Insufficient documentation

## 2021-03-29 DIAGNOSIS — Z803 Family history of malignant neoplasm of breast: Secondary | ICD-10-CM | POA: Diagnosis not present

## 2021-03-29 DIAGNOSIS — Z801 Family history of malignant neoplasm of trachea, bronchus and lung: Secondary | ICD-10-CM | POA: Insufficient documentation

## 2021-03-29 DIAGNOSIS — N879 Dysplasia of cervix uteri, unspecified: Secondary | ICD-10-CM | POA: Diagnosis present

## 2021-03-29 DIAGNOSIS — Z800 Family history of malignant neoplasm of digestive organs: Secondary | ICD-10-CM | POA: Insufficient documentation

## 2021-03-29 DIAGNOSIS — I110 Hypertensive heart disease with heart failure: Secondary | ICD-10-CM | POA: Insufficient documentation

## 2021-03-29 DIAGNOSIS — D060 Carcinoma in situ of endocervix: Secondary | ICD-10-CM | POA: Diagnosis not present

## 2021-03-29 DIAGNOSIS — D069 Carcinoma in situ of cervix, unspecified: Secondary | ICD-10-CM

## 2021-03-29 DIAGNOSIS — I509 Heart failure, unspecified: Secondary | ICD-10-CM | POA: Diagnosis not present

## 2021-03-29 HISTORY — PX: CERVICAL CONIZATION W/BX: SHX1330

## 2021-03-29 LAB — POCT PREGNANCY, URINE: Preg Test, Ur: NEGATIVE

## 2021-03-29 SURGERY — CONE BIOPSY, CERVIX
Anesthesia: General

## 2021-03-29 MED ORDER — ONDANSETRON HCL 4 MG/2ML IJ SOLN
INTRAMUSCULAR | Status: DC | PRN
  Administered 2021-03-29: 4 mg via INTRAVENOUS

## 2021-03-29 MED ORDER — LIDOCAINE-EPINEPHRINE 1 %-1:100000 IJ SOLN
INTRAMUSCULAR | Status: DC | PRN
  Administered 2021-03-29: 19 mL

## 2021-03-29 MED ORDER — ACETAMINOPHEN 10 MG/ML IV SOLN
INTRAVENOUS | Status: AC
  Filled 2021-03-29: qty 100

## 2021-03-29 MED ORDER — LIDOCAINE HCL (CARDIAC) PF 100 MG/5ML IV SOSY
PREFILLED_SYRINGE | INTRAVENOUS | Status: DC | PRN
  Administered 2021-03-29: 60 mg via INTRAVENOUS

## 2021-03-29 MED ORDER — FAMOTIDINE 20 MG PO TABS
ORAL_TABLET | ORAL | Status: AC
  Administered 2021-03-29: 20 mg via ORAL
  Filled 2021-03-29: qty 1

## 2021-03-29 MED ORDER — HYDROMORPHONE HCL 1 MG/ML IJ SOLN
INTRAMUSCULAR | Status: AC
  Filled 2021-03-29: qty 1

## 2021-03-29 MED ORDER — IBUPROFEN 600 MG PO TABS: 600.0000 mg | ORAL_TABLET | Freq: Four times a day (QID) | ORAL | 0 refills | Status: DC | PRN

## 2021-03-29 MED ORDER — OXYCODONE HCL 5 MG PO TABS: 5.0000 mg | ORAL_TABLET | Freq: Four times a day (QID) | ORAL | 0 refills | Status: DC | PRN

## 2021-03-29 MED ORDER — HYDROMORPHONE HCL 1 MG/ML IJ SOLN
INTRAMUSCULAR | Status: DC | PRN
  Administered 2021-03-29 (×2): .4 mg via INTRAVENOUS
  Administered 2021-03-29: .2 mg via INTRAVENOUS

## 2021-03-29 MED ORDER — DEXAMETHASONE SODIUM PHOSPHATE 10 MG/ML IJ SOLN
INTRAMUSCULAR | Status: DC | PRN
  Administered 2021-03-29: 10 mg via INTRAVENOUS

## 2021-03-29 MED ORDER — PROPOFOL 10 MG/ML IV BOLUS
INTRAVENOUS | Status: DC | PRN
  Administered 2021-03-29: 150 mg via INTRAVENOUS
  Administered 2021-03-29: 50 mg via INTRAVENOUS

## 2021-03-29 MED ORDER — ONDANSETRON HCL 4 MG/2ML IJ SOLN: 4.0000 mg | Freq: Once | INTRAMUSCULAR | Status: DC | PRN

## 2021-03-29 MED ORDER — MIDAZOLAM HCL 2 MG/2ML IJ SOLN
INTRAMUSCULAR | Status: DC | PRN
  Administered 2021-03-29: 2 mg via INTRAVENOUS

## 2021-03-29 MED ORDER — ACETIC ACID 3 % SOLN
Status: AC
  Filled 2021-03-29: qty 500

## 2021-03-29 MED ORDER — FERRIC SUBSULFATE 259 MG/GM EX SOLN
CUTANEOUS | Status: AC
  Filled 2021-03-29: qty 8

## 2021-03-29 MED ORDER — FENTANYL CITRATE (PF) 100 MCG/2ML IJ SOLN
INTRAMUSCULAR | Status: DC | PRN
  Administered 2021-03-29 (×2): 25 ug via INTRAVENOUS
  Administered 2021-03-29: 50 ug via INTRAVENOUS

## 2021-03-29 MED ORDER — FENTANYL CITRATE (PF) 100 MCG/2ML IJ SOLN: 25.0000 ug | INTRAMUSCULAR | Status: DC | PRN

## 2021-03-29 MED ORDER — LACTATED RINGERS IV SOLN: INTRAVENOUS | Status: DC | PRN

## 2021-03-29 MED ORDER — IODINE STRONG (LUGOLS) 5 % PO SOLN
ORAL | Status: AC
  Filled 2021-03-29: qty 1

## 2021-03-29 MED ORDER — ACETIC ACID 3 % SOLN
Status: DC | PRN
  Administered 2021-03-29: 1 via TOPICAL

## 2021-03-29 MED ORDER — FENTANYL CITRATE (PF) 100 MCG/2ML IJ SOLN
INTRAMUSCULAR | Status: AC
  Filled 2021-03-29: qty 2

## 2021-03-29 MED ORDER — MIDAZOLAM HCL 2 MG/2ML IJ SOLN
INTRAMUSCULAR | Status: AC
  Filled 2021-03-29: qty 2

## 2021-03-29 MED ORDER — SILVER NITRATE-POT NITRATE 75-25 % EX MISC
CUTANEOUS | Status: AC
  Filled 2021-03-29: qty 50

## 2021-03-29 MED ORDER — FERRIC SUBSULFATE 259 MG/GM EX SOLN
CUTANEOUS | Status: DC | PRN
  Administered 2021-03-29: 1

## 2021-03-29 MED ORDER — KETOROLAC TROMETHAMINE 30 MG/ML IJ SOLN
INTRAMUSCULAR | Status: DC | PRN
  Administered 2021-03-29: 30 mg via INTRAVENOUS

## 2021-03-29 MED ORDER — ACETAMINOPHEN 10 MG/ML IV SOLN
INTRAVENOUS | Status: DC | PRN
  Administered 2021-03-29: 1000 mg via INTRAVENOUS

## 2021-03-29 MED ORDER — ACETAMINOPHEN 500 MG PO TABS: 500.0000 mg | ORAL_TABLET | Freq: Four times a day (QID) | ORAL | 0 refills | Status: DC

## 2021-03-29 MED ORDER — CHLORHEXIDINE GLUCONATE 0.12 % MT SOLN
OROMUCOSAL | Status: AC
  Administered 2021-03-29: 15 mL via OROMUCOSAL
  Filled 2021-03-29: qty 15

## 2021-03-29 MED ORDER — LIDOCAINE-EPINEPHRINE 1 %-1:100000 IJ SOLN
INTRAMUSCULAR | Status: AC
  Filled 2021-03-29: qty 1

## 2021-03-29 SURGICAL SUPPLY — 36 items
APPLICATOR COTTON TIP 6 STRL (MISCELLANEOUS) ×1 IMPLANT
APPLICATOR COTTON TIP 6IN STRL (MISCELLANEOUS) ×2
APPLICATOR SWAB PROCTO LG 16IN (MISCELLANEOUS) ×2 IMPLANT
BLADE SURG SZ11 CARB STEEL (BLADE) ×4 IMPLANT
CANISTER SUCT 1200ML W/VALVE (MISCELLANEOUS) ×2 IMPLANT
CATH ROBINSON RED A/P 16FR (CATHETERS) ×2 IMPLANT
CNTNR SPEC 2.5X3XGRAD LEK (MISCELLANEOUS) ×1
CONT SPEC 4OZ STER OR WHT (MISCELLANEOUS) ×1
CONTAINER SPEC 2.5X3XGRAD LEK (MISCELLANEOUS) ×1 IMPLANT
DRAPE PERI LITHO V/GYN (MISCELLANEOUS) ×2 IMPLANT
DRAPE UNDER BUTTOCK W/FLU (DRAPES) ×2 IMPLANT
DRSG TELFA 3X8 NADH (GAUZE/BANDAGES/DRESSINGS) ×2 IMPLANT
ELECT LEEP BALL 5MM 12CM (MISCELLANEOUS) ×2
ELECT REM PT RETURN 9FT ADLT (ELECTROSURGICAL) ×2
ELECTRODE LEEP BALL 5MM 12CM (MISCELLANEOUS) ×1 IMPLANT
ELECTRODE REM PT RTRN 9FT ADLT (ELECTROSURGICAL) ×1 IMPLANT
GAUZE 4X4 16PLY RFD (DISPOSABLE) ×2 IMPLANT
GLOVE SURG SYN 6.5 ES PF (GLOVE) ×4 IMPLANT
GLOVE SURG UNDER POLY LF SZ6.5 (GLOVE) ×4 IMPLANT
GOWN STRL REUS W/ TWL LRG LVL3 (GOWN DISPOSABLE) ×2 IMPLANT
GOWN STRL REUS W/TWL LRG LVL3 (GOWN DISPOSABLE) ×2
KIT TURNOVER CYSTO (KITS) ×2 IMPLANT
MANIFOLD NEPTUNE II (INSTRUMENTS) ×2 IMPLANT
NEEDLE SPNL 22GX3.5 QUINCKE BK (NEEDLE) ×2 IMPLANT
NS IRRIG 500ML POUR BTL (IV SOLUTION) ×2 IMPLANT
PACK BASIN MINOR ARMC (MISCELLANEOUS) ×2 IMPLANT
PAD OB MATERNITY 4.3X12.25 (PERSONAL CARE ITEMS) ×2 IMPLANT
PAD PREP 24X41 OB/GYN DISP (PERSONAL CARE ITEMS) ×2 IMPLANT
SOL PREP PVP 2OZ (MISCELLANEOUS) ×2
SOLUTION PREP PVP 2OZ (MISCELLANEOUS) ×1 IMPLANT
SUT SILK 2 0 SH (SUTURE) ×2 IMPLANT
SUT VIC AB 0 CT1 36 (SUTURE) ×8 IMPLANT
SUT VIC AB 2-0 CT1 27 (SUTURE) ×2
SUT VIC AB 2-0 CT1 TAPERPNT 27 (SUTURE) ×2 IMPLANT
SYR 10ML LL (SYRINGE) ×2 IMPLANT
SYR CONTROL 10ML LL (SYRINGE) ×2 IMPLANT

## 2021-03-29 NOTE — Transfer of Care (Signed)
Immediate Anesthesia Transfer of Care Note  Patient: Journalist, newspaper  Procedure(s) Performed: CONIZATION CERVIX WITH BIOPSY (N/A )  Patient Location: PACU  Anesthesia Type:General  Level of Consciousness: awake, alert  and oriented  Airway & Oxygen Therapy: Patient Spontanous Breathing and Patient connected to face mask oxygen  Post-op Assessment: Report given to RN and Post -op Vital signs reviewed and stable  Post vital signs: Reviewed and stable  Last Vitals:  Vitals Value Taken Time  BP 149/88 03/29/21 1401  Temp    Pulse 98 03/29/21 1403  Resp 14 03/29/21 1403  SpO2 100 % 03/29/21 1403  Vitals shown include unvalidated device data.  Last Pain:  Vitals:   03/29/21 1200  TempSrc: Temporal  PainSc: 0-No pain         Complications: No complications documented.

## 2021-03-29 NOTE — Anesthesia Preprocedure Evaluation (Signed)
Anesthesia Evaluation  Patient identified by MRN, date of birth, ID band Patient awake    Reviewed: Allergy & Precautions, H&P , NPO status , Patient's Chart, lab work & pertinent test results  History of Anesthesia Complications Negative for: history of anesthetic complications  Airway Mallampati: II       Dental  (+) Missing, Chipped, Poor Dentition   Pulmonary asthma (pt denies, does not use inhalers) , neg sleep apnea, neg COPD, Current Smoker and Patient abstained from smoking.,    breath sounds clear to auscultation       Cardiovascular hypertension, (-) angina(-) Past MI and (-) Cardiac Stents (-) dysrhythmias  Rhythm:regular Rate:Normal     Neuro/Psych Anxiety negative neurological ROS     GI/Hepatic Neg liver ROS, GERD  ,  Endo/Other  negative endocrine ROS  Renal/GU negative Renal ROS  negative genitourinary   Musculoskeletal   Abdominal   Peds  Hematology negative hematology ROS (+)   Anesthesia Other Findings Past Medical History: No date: CHF (congestive heart failure) (HCC)     Comment:  After giving birth 2011, pt. has to return to hospital               for "fluid around lungs and heart".  No date: GERD (gastroesophageal reflux disease) No date: Hypertension No date: Pre-eclampsia  Past Surgical History: No date: DILATION AND CURETTAGE OF UTERUS     Comment:  elective AB  BMI    Body Mass Index: 30.47 kg/m      Reproductive/Obstetrics negative OB ROS                            Anesthesia Physical Anesthesia Plan  ASA: III  Anesthesia Plan: General LMA   Post-op Pain Management:    Induction:   PONV Risk Score and Plan: Dexamethasone and Ondansetron  Airway Management Planned:   Additional Equipment:   Intra-op Plan:   Post-operative Plan:   Informed Consent: I have reviewed the patients History and Physical, chart, labs and discussed the procedure  including the risks, benefits and alternatives for the proposed anesthesia with the patient or authorized representative who has indicated his/her understanding and acceptance.     Dental Advisory Given  Plan Discussed with: Anesthesiologist, CRNA and Surgeon  Anesthesia Plan Comments:         Anesthesia Quick Evaluation

## 2021-03-29 NOTE — Anesthesia Procedure Notes (Signed)
Procedure Name: LMA Insertion Date/Time: 03/29/2021 1:05 PM Performed by: Hermenia Bers, CRNA Pre-anesthesia Checklist: Patient identified, Patient being monitored, Timeout performed, Emergency Drugs available and Suction available Patient Re-evaluated:Patient Re-evaluated prior to induction Oxygen Delivery Method: Circle system utilized Preoxygenation: Pre-oxygenation with 100% oxygen Induction Type: IV induction Ventilation: Mask ventilation without difficulty LMA: LMA inserted LMA Size: 5.0 Tube type: Oral Number of attempts: 1 Placement Confirmation: positive ETCO2 and breath sounds checked- equal and bilateral Tube secured with: Tape Dental Injury: Teeth and Oropharynx as per pre-operative assessment

## 2021-03-29 NOTE — Interval H&P Note (Signed)
History and Physical Interval Note:  03/29/2021 12:38 PM  Natasha Christian  has presented today for surgery, with the diagnosis of CIN 3.  The various methods of treatment have been discussed with the patient and family. After consideration of risks, benefits and other options for treatment, the patient has consented to  Procedure(s): CONIZATION CERVIX WITH BIOPSY (N/A) as a surgical intervention.  The patient's history has been reviewed, patient examined, no change in status, stable for surgery.  I have reviewed the patient's chart and labs.  Questions were answered to the patient's satisfaction.     Tyne Banta R Briston Lax

## 2021-03-29 NOTE — Discharge Instructions (Addendum)
Cervical Conization, Care After This sheet gives you information about how to care for yourself after your procedure. Your doctor may also give you more specific instructions. If you have problems or questions, contact your doctor. What can I expect after the procedure? After the procedure, it is common to have:  A groggy feeling, if you were given medicine to make you fall asleep (general anesthetic).  Cramps that feel like menstrual cramps.  Bloody discharge or light to moderate bleeding.  Dark fluid from the vagina (vaginal discharge). This fluid may look similar to coffee grounds. Follow these instructions at home: Medicines  Take over-the-counter and prescription medicines only as told by your doctor.  Do not take aspirin until your doctor says it is okay. Activity  Rest as told by your doctor.  For 7-14 days after your procedure, avoid: ? Being very active. ? Exercising. ? Heavy lifting.  Do not lift anything that is heavier than 10 lb (4.5 kg), or the limit that you are told, until your doctor says that it is safe.  Return to your normal activities as told by your doctor. Ask your doctor what activities are safe for you.   General instructions  You may eat your normal diet unless your doctor tells you not to do so.  Drink enough fluid to keep your pee (urine) pale yellow.  Do not take baths, swim, or use a hot tub until your doctor approves. Ask your doctor if you may take showers. You may only be allowed to take sponge baths.  Do not douche, have sex, or put anything in the vagina, including tampons, until your doctor says it is okay.  Keep all follow-up visits as told by your doctor. This is important.   Contact a doctor if:  You get a rash.  You are dizzy or light-headed.  You feel like you may vomit (nauseous).  You vomit.  You have fluid from your vagina that smells bad. Get help right away if:  There are bloody clumps (clots) coming from your  vagina.  You have more bleeding than you would have in a normal menstrual period. For example, you soak a pad in less than 1 hour.  You have a fever.  You have more and more cramps.  You pass out (faint).  You have pain when peeing.  You have very bad pain, or your pain gets worse. Medicine does not help your pain.  You have blood in your pee. Summary  After the procedure, it is common to have cramps, some bleeding, and dark or bloody fluid from your vagina.  Do not douche, have sex, or use tampons until your doctor says it is okay.  For about 7-14 days after your procedure, try not to exercise or lift heavy objects. This information is not intended to replace advice given to you by your health care provider. Make sure you discuss any questions you have with your health care provider. Document Revised: 06/14/2019 Document Reviewed: 06/14/2019 Elsevier Patient Education  2021 [employer] Inc.  AMBULATORY SURGERY  DISCHARGE INSTRUCTIONS   1) The drugs that you were given will stay in your system until tomorrow so for the next 24 hours you should not:  A) Drive an automobile B) Make any legal decisions C) Drink any alcoholic beverage   2) You may resume regular meals tomorrow.  Today it is better to start with liquids and gradually work up to solid foods.  You may eat anything you prefer, but it is  better to start with liquids, then soup and crackers, and gradually work up to solid foods.   3) Please notify your doctor immediately if you have any unusual bleeding, trouble breathing, redness and pain at the surgery site, drainage, fever, or pain not relieved by medication.    4) Additional Instructions:    Please contact your physician with any problems or Same Day Surgery at [phone], Monday through Friday 6 am to 4 pm, or [facility] number at [phone].

## 2021-03-29 NOTE — Op Note (Signed)
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    PROCEDURE IN DETAIL: After informed consent was obtained, the patient was taken to the operating room where general anesthesia was administered without difficulty. The patient was positioned in the dorsal lithotomy position in [place] stirrups. Time-out was performed. The patient was examined under anesthesia, and the above findings were noted. She was prepped and draped in normal sterile fashion. A straight catheretization was performed.  A weighted speculum and deaver were placed inside the patient's vagina. The above mentioned findings were noted. The anterior lip of the cervix was grasped with the single tooth tenaculum.    Paracervical block performed with 1% lidocaine with epinephrine. The cervical conization was then performed to excise the cervix using the 11 blade scalpel. A silk suture was placed at the 3,6,and 9 o'clock position. A post conization ECC was performed. The ball electrocautery was then used to obtain hemostasis. AstrinGyn was applied to the cervix. All instruments were then removed from the vagina. Excellent hemostasis was obtained at the end of the procedure.    The patient tolerated the procedure well. Sponge, lap, needle, and instrument counts were correct x 2. The patient was transferred to the recovery room awake, alert and breathing independently in stable condition.  Adelene Idler MD [facility], [facility] 03/19/18 11:55 AM

## 2021-04-03 LAB — SURGICAL PATHOLOGY

## 2021-04-05 NOTE — Anesthesia Postprocedure Evaluation (Signed)
Anesthesia Post Note  Patient: Journalist, newspaper  Procedure(s) Performed: CONIZATION CERVIX WITH BIOPSY (N/A )  Patient location during evaluation: PACU Anesthesia Type: General Level of consciousness: awake and alert Pain management: pain level controlled Vital Signs Assessment: post-procedure vital signs reviewed and stable Respiratory status: spontaneous breathing, nonlabored ventilation and respiratory function stable Cardiovascular status: blood pressure returned to baseline and stable Postop Assessment: no apparent nausea or vomiting Anesthetic complications: no   No complications documented.   Last Vitals:  Vitals:   03/29/21 1510 03/29/21 1534  BP: 121/80 127/79  Pulse: (!) 55 (!) 50  Resp: 16 16  Temp: 36.4 C (!) 36.3 C  SpO2: 100% 100%    Last Pain:  Vitals:   03/29/21 1534  TempSrc: Temporal  PainSc: 2                  Christia Reading

## 2021-04-09 IMAGING — MG DIGITAL DIAGNOSTIC BILAT W/ TOMO W/ CAD
8 of 14 series · 8 of 40 positions shown · non-contrast
Comparison: None.

CLINICAL DATA: 34-year-old female presenting for baseline mammogram
to evaluate a palpable lump in the medial left breast which began
several months ago. The site is tender. Initially felt like 1 larger
area, but now she feels there are 2 adjacent palpable lumps.

EXAM:
DIGITAL DIAGNOSTIC BILATERAL MAMMOGRAM WITH TOMO AND CAD; ULTRASOUND
LEFT BREAST LIMITED
TECHNIQUE: Bilateral digital diagnostic mammography and breast tomosynthesis
was performed. Digital images of the breasts were evaluated with
computer-aided detection.

[L TAN synth-2D]
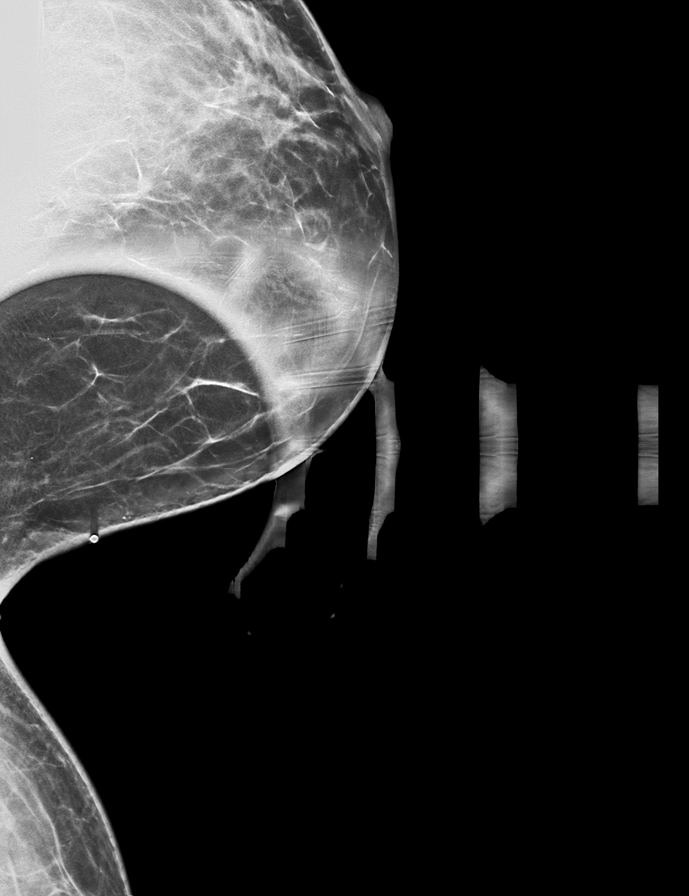

[R MLO synth-2D (1 of 2)]
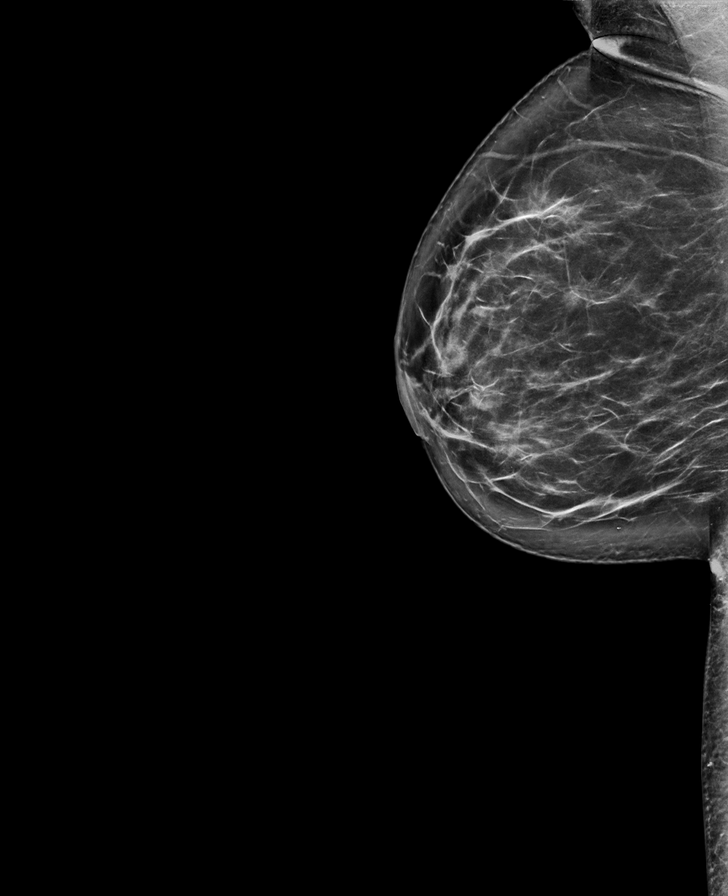

[L CC synth-2D (1 of 2)]
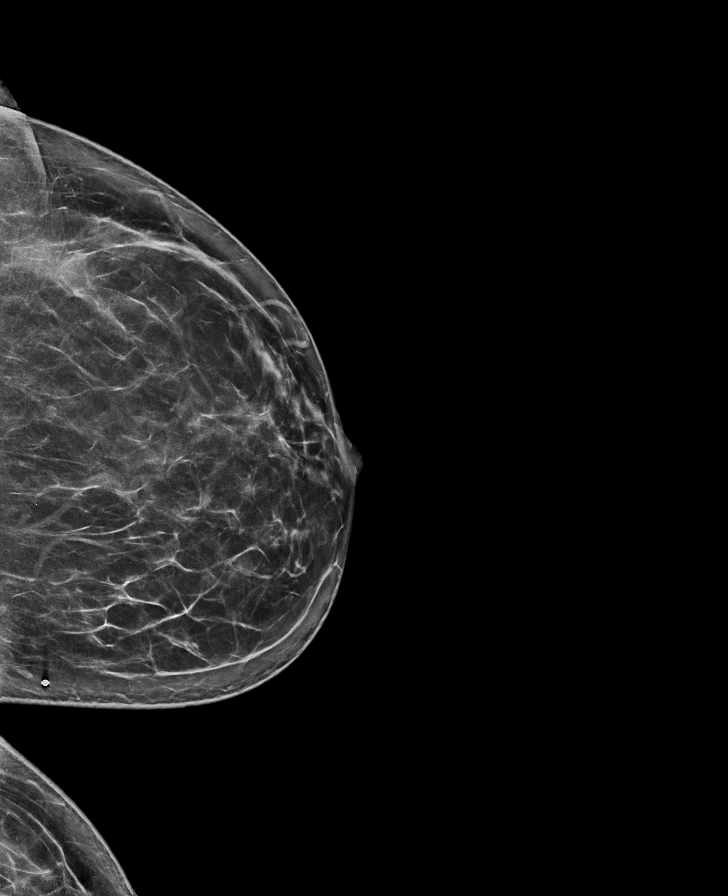

[R CC synth-2D]
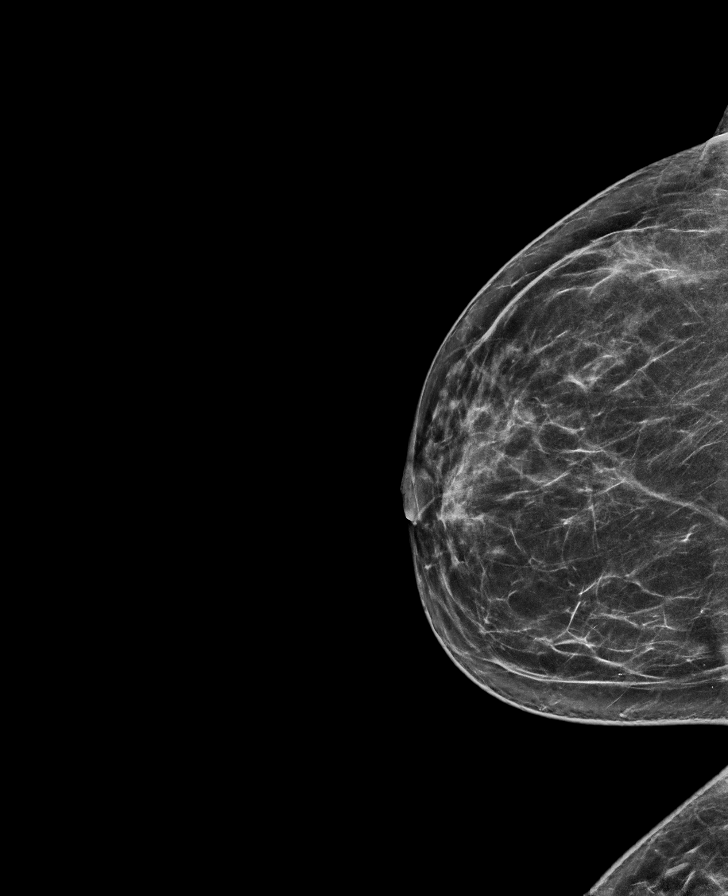

[R MLO synth-2D (2 of 2)]
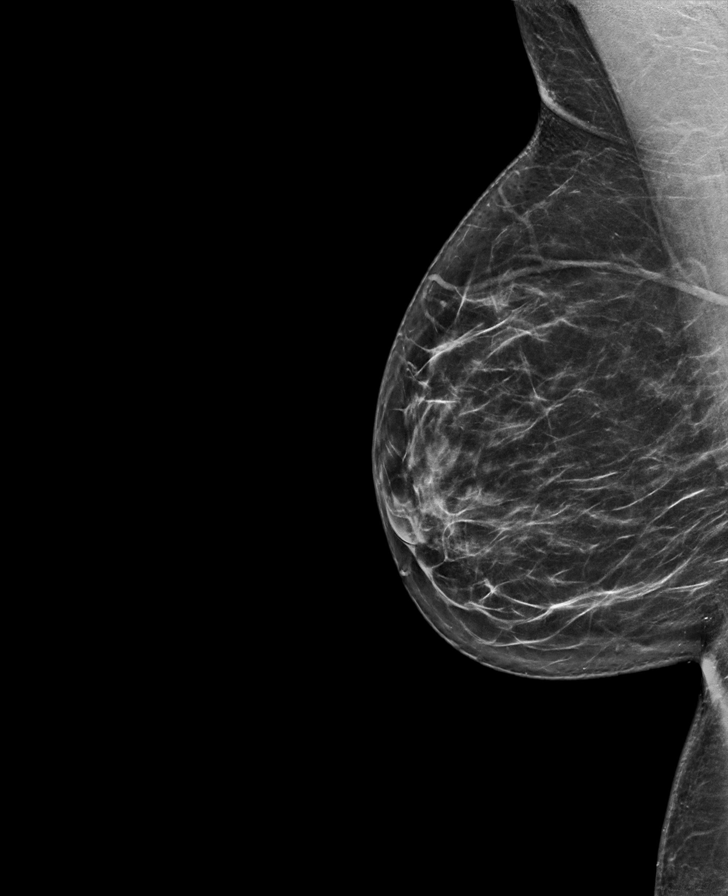

[L CC synth-2D (2 of 2)]
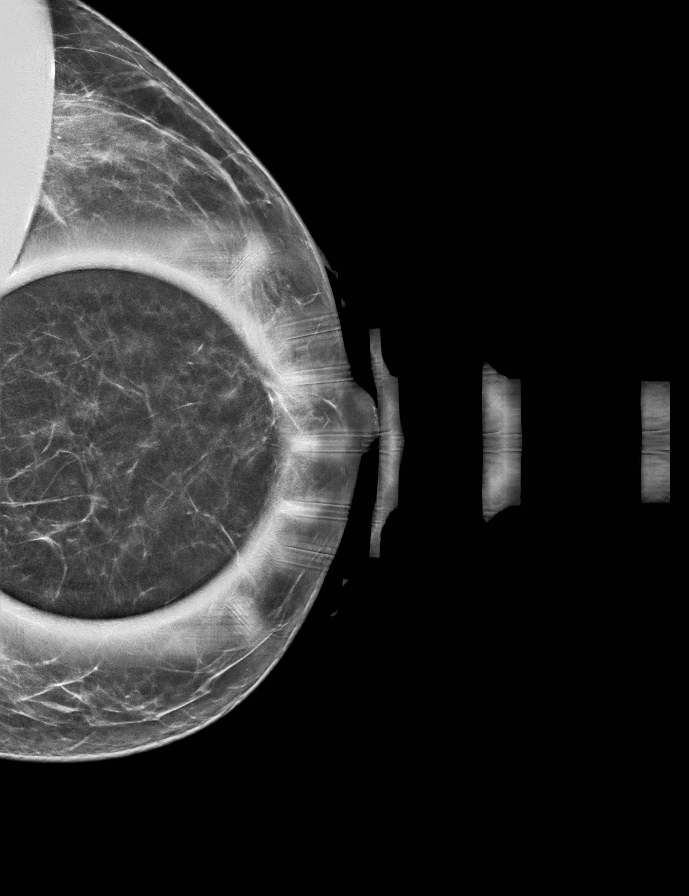

[L MLO synth-2D]
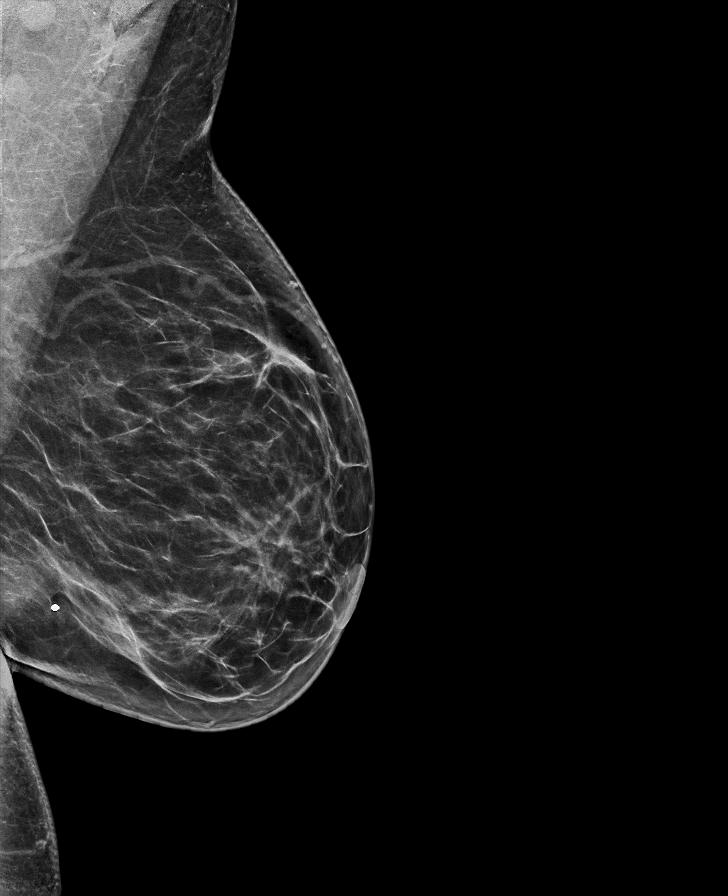

[R MLO tomo · tomo slice 49/96.0]
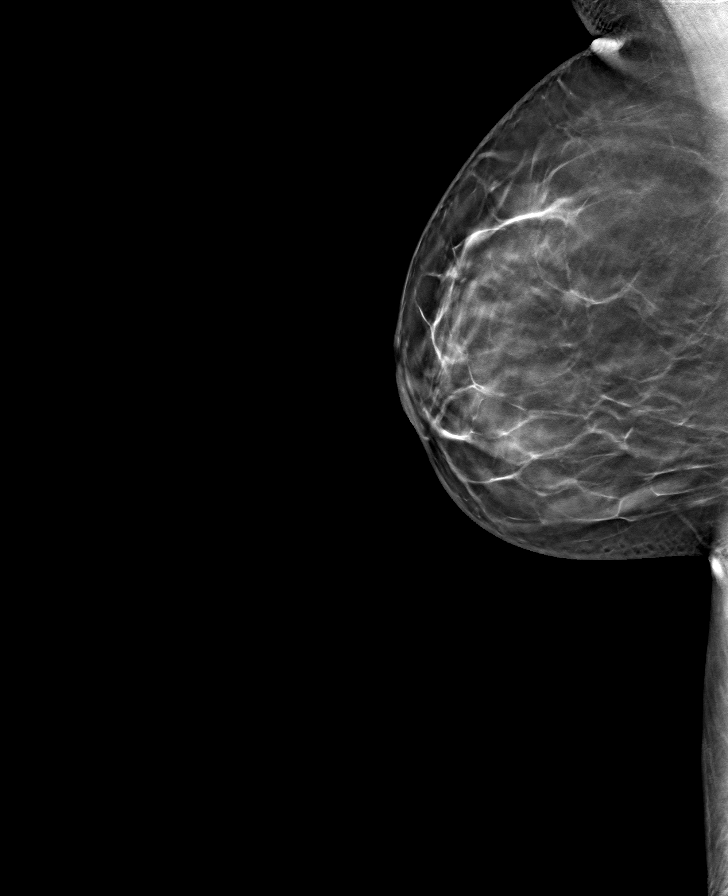

[8 of 40 positions shown; findings below may reference images not displayed]

ACR Breast Density Category b: There are scattered areas of
fibroglandular density.
FINDINGS: Spot compression tomosynthesis images of the palpable site in the
medial left breast demonstrates skin thickening/superficial
asymmetry deep to the skin marker indicating the palpable site of
concern. This may represent a sebaceous cyst. An asymmetry in the
slightly medial left breast was seen on the initial CC view, however
this resolved with spot compression tomosynthesis imaging consistent
with overlapping fibroglandular tissue. No suspicious
calcifications, masses or areas of distortion are seen in the
bilateral breasts.

Ultrasound targeted to the palpable site in the left breast at [DATE],
10 cm from the nipple demonstrates a hypoechoic oval mass within the
skin measuring 1.7 x 0.4 x 1.1 cm.
IMPRESSION: 1. The palpable mass in the left breast corresponds with a benign
sebaceous cyst.

2.  No mammographic evidence of malignancy in the bilateral breasts.

RECOMMENDATION:
1. Screening mammogram at age 39 unless there are persistent or
intervening clinical concerns. (Code:7L-E-BS7)

I have discussed the findings and recommendations with the patient.
If applicable, a reminder letter will be sent to the patient
regarding the next appointment.

BI-RADS CATEGORY  2: Benign.

## 2021-04-26 ENCOUNTER — Ambulatory Visit (INDEPENDENT_AMBULATORY_CARE_PROVIDER_SITE_OTHER): Payer: Medicaid Other | Admitting: Obstetrics and Gynecology

## 2021-04-26 ENCOUNTER — Other Ambulatory Visit: Payer: Self-pay

## 2021-04-26 ENCOUNTER — Encounter: Payer: Self-pay | Admitting: Obstetrics and Gynecology

## 2021-04-26 VITALS — BP 120/80 | Ht 68.0 in | Wt 200.0 lb

## 2021-04-26 DIAGNOSIS — Z4889 Encounter for other specified surgical aftercare: Secondary | ICD-10-CM

## 2021-04-26 DIAGNOSIS — Z9889 Other specified postprocedural states: Secondary | ICD-10-CM

## 2021-04-26 NOTE — Progress Notes (Signed)
  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  Subjective: Patient reports marked improvement in her preop symptoms. Eating a regular diet without difficulty. Pain is controlled without any medications.  Activity: normal activities of daily living. Patient reports additional symptom's since surgery of some vaginal bleeding until about 1 week ago. Objective: BP 120/80   Ht 5\' 8"  (1.727 m)   Wt 200 lb (90.7 kg)   BMI 30.41 kg/m  Physical Exam Constitutional:      Appearance: She is well-developed.  Genitourinary:     Genitourinary Comments: External: Normal appearing vulva. No lesions noted.  Speculum examination: Normal appearing cervix, well healed. No blood in the vaginal vault.   HENT:     Head: Normocephalic and atraumatic.  Neck:     Thyroid: No thyromegaly.  Cardiovascular:     Rate and Rhythm: Normal rate and regular rhythm.     Heart sounds: Normal heart sounds.  Pulmonary:     Effort: Pulmonary effort is normal.     Breath sounds: Normal breath sounds.  Abdominal:     General: Bowel sounds are normal. There is no distension.     Palpations: Abdomen is soft. There is no mass.  Musculoskeletal:     Cervical back: Neck supple.  Neurological:     Mental Status: She is alert and oriented to person, place, and time.  Skin:    General: Skin is warm and dry.  Psychiatric:        Behavior: Behavior normal.        Thought Content: Thought content normal.        Judgment: Judgment normal.  Vitals reviewed.     Assessment: s/p :  Cervical conization stable  Plan: Patient has done well after surgery with no apparent complications.  I have discussed the post-operative course to date, and the expected progress moving forward.  The patient understands what complications to be concerned about.  I will see the patient in routine follow up, or sooner if needed.    Activity plan: No restriction. Follow up pap smear in 1  year.  MD, [facility], [facility] 04/26/2021 9:49 AM

## 2021-09-26 ENCOUNTER — Other Ambulatory Visit: Payer: Self-pay | Admitting: Advanced Practice Midwife

## 2021-09-26 ENCOUNTER — Other Ambulatory Visit: Payer: Self-pay

## 2021-09-26 ENCOUNTER — Encounter: Payer: Self-pay | Admitting: Advanced Practice Midwife

## 2021-09-26 ENCOUNTER — Other Ambulatory Visit (HOSPITAL_COMMUNITY)
Admission: RE | Admit: 2021-09-26 | Discharge: 2021-09-26 | Disposition: A | Discharge status: Home / Self Care | Payer: Medicaid Other | Source: Ambulatory Visit | Attending: Advanced Practice Midwife | Admitting: Advanced Practice Midwife

## 2021-09-26 ENCOUNTER — Ambulatory Visit (INDEPENDENT_AMBULATORY_CARE_PROVIDER_SITE_OTHER): Payer: Medicaid Other | Admitting: Advanced Practice Midwife

## 2021-09-26 VITALS — BP 112/74 | HR 56 | Ht 68.0 in | Wt 209.0 lb

## 2021-09-26 DIAGNOSIS — R21 Rash and other nonspecific skin eruption: Secondary | ICD-10-CM

## 2021-09-26 DIAGNOSIS — Z1159 Encounter for screening for other viral diseases: Secondary | ICD-10-CM | POA: Diagnosis not present

## 2021-09-26 DIAGNOSIS — Z113 Encounter for screening for infections with a predominantly sexual mode of transmission: Secondary | ICD-10-CM | POA: Diagnosis present

## 2021-09-27 ENCOUNTER — Encounter: Payer: Self-pay | Admitting: Advanced Practice Midwife

## 2021-09-27 LAB — RPR QUALITATIVE: RPR Ser Ql: NONREACTIVE

## 2021-09-27 LAB — HEPATITIS C ANTIBODY: Hep C Virus Ab: <0.1 s/co ratio (ref 0.0–0.9)

## 2021-09-27 LAB — CERVICOVAGINAL ANCILLARY ONLY
Chlamydia: NEGATIVE
Comment: NEGATIVE
Comment: NEGATIVE
Comment: NORMAL
Neisseria Gonorrhea: NEGATIVE
Trichomonas: NEGATIVE

## 2021-09-27 LAB — HEPATITIS B SURFACE ANTIBODY,QUALITATIVE: Hep B Surface Ab, Qual: REACTIVE

## 2021-09-27 LAB — HIV ANTIBODY (ROUTINE TESTING W REFLEX): HIV Screen 4th Generation wRfx: NONREACTIVE

## 2021-09-27 NOTE — Progress Notes (Signed)
Patient ID: Natasha Christian, female   DOB: 1986/04/14, 35 y.o.   MRN: [id number]  Reason for Consult: Rash (Private area and inner thighs every cycle)    Subjective:  Date of Service: 09/26/2021  HPI:  Natasha Christian is a 35 y.o. G7 P40 female being seen for painful bumps in the groin area. She first noticed them about a month ago. Her most recent intercourse was 2 months ago with a new partner and without protection. The bumps are described as blister like and painful. They "come and go". She denies any change of body care products. She has mild vaginal itching. She denies discharge, irritation or odor. She requests STD testing.   Past Medical History:  Diagnosis Date   CHF (congestive heart failure) (HCC)    After giving birth 2011, pt. has to return to hospital for "fluid around lungs and heart".    GERD (gastroesophageal reflux disease)    Hypertension    Pre-eclampsia    Family History  Problem Relation Age of Onset   Diabetes Maternal Grandmother    Diabetes Mother    Lung cancer Mother    Pancreatic cancer Maternal Uncle    Breast cancer Maternal Aunt    Past Surgical History:  Procedure Laterality Date   CERVICAL CONIZATION W/BX N/A 03/29/2021   Procedure: CONIZATION CERVIX WITH BIOPSY;  Surgeon: Natale Milch, MD;  Location: [facility] ORS;  Service: Gynecology;  Laterality: N/A;   DILATION AND CURETTAGE OF UTERUS     elective AB    Short Social History:  Social History   Tobacco Use   Smoking status: Every Day    Types: Cigarettes   Smokeless tobacco: Never  Substance Use Topics   Alcohol use: Yes    Comment: occasionally    No Known Allergies  No current outpatient medications on file.   No current facility-administered medications for this visit.    Review of Systems  Constitutional:  Negative for chills and fever.  HENT:  Negative for congestion, ear discharge, ear pain, hearing loss, sinus pain and sore throat.   Eyes:  Negative for blurred  vision and double vision.  Respiratory:  Negative for cough, shortness of breath and wheezing.   Cardiovascular:  Negative for chest pain, palpitations and leg swelling.  Gastrointestinal:  Negative for abdominal pain, blood in stool, constipation, diarrhea, heartburn, melena, nausea and vomiting.  Genitourinary:  Negative for dysuria, flank pain, frequency, hematuria and urgency.  Musculoskeletal:  Negative for back pain, joint pain and myalgias.  Skin:  Positive for rash. Negative for itching.  Neurological:  Negative for dizziness, tingling, tremors, sensory change, speech change, focal weakness, seizures, loss of consciousness, weakness and headaches.  Endo/Heme/Allergies:  Negative for environmental allergies. Does not bruise/bleed easily.  Psychiatric/Behavioral:  Negative for depression, hallucinations, memory loss, substance abuse and suicidal ideas. The patient is not nervous/anxious and does not have insomnia.        Objective:  Objective   Vitals:   09/26/21 0912  BP: 112/74  Pulse: (!) 56  Weight: 209 lb (94.8 kg)  Height: 5\' 8"  (1.727 m)   Body mass index is 31.78 kg/m. Constitutional: Well nourished, well developed female in no acute distress.  HEENT: normal Skin: Warm and dry.  Cardiovascular: Regular rate and rhythm.   Respiratory: Clear to auscultation bilateral. Normal respiratory effort Neuro: DTRs 2+, Cranial nerves grossly intact Psych: Alert and Oriented x3. No memory deficits. Normal mood and affect.  MS: normal gait, normal bilateral lower extremity  ROM/strength/stability.  Pelvic exam: is not limited by body habitus EGBUS: disperse lesions that appear to be boils, one area on left mons that is open- will culture for HSV, some tenderness to palpation Vagina: within normal limits and with normal mucosa Upper/inner thighs: disperse lesions that appear to be boils   Update to visit: labs are normal as of 9/29, all HSV labs are pending Assessment/Plan:      35 y.o. G7 P3134 with more likely groin area boils versus possible primary HSV outbreak  Aptima- STDs HSV culture Blood work- STDs including HSV Follow up as needed after labs result    Tresea Mall CNM [facility] 09/27/2021, 1:20 PM

## 2021-09-29 LAB — HERPES SIMPLEX VIRUS CULTURE

## 2023-07-10 DIAGNOSIS — G5603 Carpal tunnel syndrome, bilateral upper limbs: Secondary | ICD-10-CM | POA: Insufficient documentation

## 2024-02-20 ENCOUNTER — Encounter: Payer: Self-pay | Admitting: Advanced Practice Midwife

## 2024-02-20 ENCOUNTER — Ambulatory Visit (INDEPENDENT_AMBULATORY_CARE_PROVIDER_SITE_OTHER): Payer: Medicaid Other | Admitting: Advanced Practice Midwife

## 2024-02-20 ENCOUNTER — Other Ambulatory Visit (HOSPITAL_COMMUNITY)
Admission: RE | Admit: 2024-02-20 | Discharge: 2024-02-20 | Disposition: A | Discharge status: Home / Self Care | Payer: Medicaid Other | Source: Ambulatory Visit | Attending: Advanced Practice Midwife | Admitting: Advanced Practice Midwife

## 2024-02-20 VITALS — BP 146/93 | HR 58 | Ht 68.0 in | Wt 220.0 lb

## 2024-02-20 DIAGNOSIS — Z01419 Encounter for gynecological examination (general) (routine) without abnormal findings: Secondary | ICD-10-CM | POA: Insufficient documentation

## 2024-02-20 DIAGNOSIS — Z113 Encounter for screening for infections with a predominantly sexual mode of transmission: Secondary | ICD-10-CM

## 2024-02-20 DIAGNOSIS — N898 Other specified noninflammatory disorders of vagina: Secondary | ICD-10-CM

## 2024-02-20 DIAGNOSIS — Z124 Encounter for screening for malignant neoplasm of cervix: Secondary | ICD-10-CM

## 2024-02-20 DIAGNOSIS — Z3046 Encounter for surveillance of implantable subdermal contraceptive: Secondary | ICD-10-CM

## 2024-02-20 DIAGNOSIS — Z3202 Encounter for pregnancy test, result negative: Secondary | ICD-10-CM

## 2024-02-20 DIAGNOSIS — Z1159 Encounter for screening for other viral diseases: Secondary | ICD-10-CM

## 2024-02-20 DIAGNOSIS — L732 Hidradenitis suppurativa: Secondary | ICD-10-CM

## 2024-02-20 LAB — POCT URINE PREGNANCY: Preg Test, Ur: NEGATIVE

## 2024-02-20 MED ORDER — ETONOGESTREL 68 MG ~~LOC~~ IMPL
68.0000 mg | DRUG_IMPLANT | Freq: Once | SUBCUTANEOUS | Status: AC
  Administered 2024-02-20: 68 mg via SUBCUTANEOUS

## 2024-02-20 NOTE — Patient Instructions (Addendum)
Vaginal irritation/urethral irritation: soak in tub with 1 cup sea salt daily for several days. Alternatively can use apple cider vinegar- 1 cup in tub

## 2024-02-21 LAB — HSV 1 AND 2 AB, IGG
HSV 1 Glycoprotein G Ab, IgG: REACTIVE — AB
HSV 2 IgG, Type Spec: REACTIVE — AB

## 2024-02-21 LAB — HIV ANTIBODY (ROUTINE TESTING W REFLEX): HIV Screen 4th Generation wRfx: NONREACTIVE

## 2024-02-21 LAB — HEPATITIS C ANTIBODY: Hep C Virus Ab: NONREACTIVE

## 2024-02-21 LAB — HEPATITIS B SURFACE ANTIBODY,QUALITATIVE: Hep B Surface Ab, Qual: REACTIVE

## 2024-02-21 LAB — RPR QUALITATIVE: RPR Ser Ql: NONREACTIVE

## 2024-02-22 ENCOUNTER — Encounter: Payer: Self-pay | Admitting: Advanced Practice Midwife

## 2024-02-22 DIAGNOSIS — B009 Herpesviral infection, unspecified: Secondary | ICD-10-CM

## 2024-02-22 MED ORDER — DOXYCYCLINE HYCLATE 100 MG PO CAPS: 100.0000 mg | ORAL_CAPSULE | Freq: Two times a day (BID) | ORAL | 2 refills | Status: AC

## 2024-02-22 NOTE — Progress Notes (Signed)
 [facility]  Gynecology Annual Exam   PCP: Alberteen Spindle, CNM  Chief Complaint:  Chief Complaint  Patient presents with   Gynecologic Exam   Contraception    Nexplanon replacement    History of Present Illness: Patient is a 38 y.o. Z6X0960 presents for annual exam. The patient has complaint today of vaginal pain before her period. She also reports skin infections in underarm and groin areas.   LMP: Patient's last menstrual period was 02/09/2024. Average Interval: regular, every 2 months Duration of flow: 2 days Heavy Menses: no Clots: no Intermenstrual Bleeding: no Postcoital Bleeding: no Dysmenorrhea: no  The patient is sexually active. She currently uses Nexplanon for contraception. She reports some pain with deeper penetration.  The patient does perform self breast exams.  There is no notable family history of breast or ovarian cancer in her family.  The patient wears seatbelts: yes.   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 She reports good diet, adequate hydration and 5-6 hours of sleep usually .    The patient denies current symptoms of depression.  She is able to cope with her level of anxiety.  Review of Systems: Review of Systems  Constitutional:  Negative for chills and fever.  HENT:  Negative for congestion, ear discharge, ear pain, hearing loss, sinus pain and sore throat.   Eyes:  Negative for blurred vision and double vision.  Respiratory:  Negative for cough, shortness of breath and wheezing.   Cardiovascular:  Negative for chest pain, palpitations and leg swelling.  Gastrointestinal:  Negative for abdominal pain, blood in stool, constipation, diarrhea, heartburn, melena, nausea and vomiting.  Genitourinary:  Negative for dysuria, flank pain, frequency, hematuria and urgency.       Positive for vaginal discharge  Musculoskeletal:  Positive for joint pain. Negative for back pain and myalgias.  Skin:  Negative for  itching and rash.  Neurological:  Positive for headaches. Negative for dizziness, tingling, tremors, sensory change, speech change, focal weakness, seizures, loss of consciousness and weakness.  Endo/Heme/Allergies:  Negative for environmental allergies. Does not bruise/bleed easily.  Psychiatric/Behavioral:  Negative for depression, hallucinations, memory loss, substance abuse and suicidal ideas. The patient is not nervous/anxious and does not have insomnia.        Positive for anxiety    Past Medical History:  Patient Active Problem List   Diagnosis Date Noted Date Diagnosed   Bilateral carpal tunnel syndrome 07/10/2023    High grade squamous intraepithelial lesion (HGSIL), grade 3 CIN, on biopsy of cervix      Conization for HGSIL CIN 3 on 03/29/21 with resection margins neg for dysplasia    Abnormal Pap smear of cervix ASCUS HPV + 01/11/21 01/16/2021     Needs colpo=done 02/02/21 [facility]--CIN 3 on bx with bx x2 and ECC=  cx conization  03/29/21 by Dr. Jerene Pitch under general with resection margins neg for dysplasia: CIN 2-3, ECC neg    Smoker 1ppd 01/11/2021    Asthma 01/11/2021    Anxiety dx'd 2019 01/11/2021     Past Surgical History:  Past Surgical History:  Procedure Laterality Date   CERVICAL CONIZATION W/BX N/A 03/29/2021   Procedure: CONIZATION CERVIX WITH BIOPSY;  Surgeon: Natale Milch, MD;  Location: [facility] ORS;  Service: Gynecology;  Laterality: N/A;   DILATION AND CURETTAGE OF UTERUS     elective AB    Gynecologic History:  Patient's last menstrual period was 02/09/2024. Contraception: Nexplanon Last Pap:  2022 Results were: high-grade squamous intraepithelial neoplasia  (HGSIL-encompassing moderate and severe dysplasia) had Conization with Dr Jerene Pitch with recommendation for follow up PAP in 1 year  Obstetric History: G7P3134  Family History:  Family History  Problem Relation Age of Onset   Diabetes Mother    Lung cancer Mother    Pancreatic cancer Mother         early 15s   Breast cancer Maternal Aunt        early 75s   Pancreatic cancer Maternal Uncle        early 6s   Diabetes Maternal Grandmother     Social History:  Social History   Socioeconomic History   Marital status: Single    Spouse name: Not on file   Number of children: Not on file   Years of education: Not on file   Highest education level: Not on file  Occupational History   Not on file  Tobacco Use   Smoking status: Every Day    Types: Cigarettes   Smokeless tobacco: Never  Vaping Use   Vaping status: Never Used  Substance and Sexual Activity   Alcohol use: Yes    Comment: occasionally   Drug use: No   Sexual activity: Yes    Birth control/protection: Implant  Other Topics Concern   Not on file  Social History Narrative   Not on file   Social Drivers of Health   Financial Resource Strain: Not on file  Food Insecurity: Not on file  Transportation Needs: Not on file  Physical Activity: Not on file  Stress: Not on file  Social Connections: Not on file  Intimate Partner Violence: Not At Risk (01/11/2021)   Humiliation, Afraid, Rape, and Kick questionnaire    Fear of Current or Ex-Partner: No    Emotionally Abused: No    Physically Abused: No    Sexually Abused: No    Allergies:  No Known Allergies  Medications: Prior to Admission medications   Medication Sig Start Date End Date Taking? Authorizing Provider  etonogestrel (NEXPLANON) 68 MG IMPL implant 1 each by Subdermal route once. 02/20/24 02/19/27 Yes Tresea Mall, CNM    Physical Exam Vitals: Blood pressure (!) 146/93, pulse (!) 58, height 5\' 8"  (1.727 m), weight 220 lb (99.8 kg). Initial BP: 148/88 Recommend patient f/u with PCP for evaluation of essential hypertension   General: NAD HEENT: normocephalic, anicteric Thyroid: no enlargement, no palpable nodules Skin: a few hidradenitis like appearing lesions R axilla and thigh/groin area Pulmonary: No increased work of breathing,  CTAB Cardiovascular: RRR, distal pulses 2+ Breast: Breast symmetrical, no tenderness, no palpable nodules or masses, no skin or nipple retraction present, no nipple discharge.  No axillary or supraclavicular lymphadenopathy. Abdomen: NABS, soft, non-tender, non-distended.  Umbilicus without lesions.  No hepatomegaly, splenomegaly or masses palpable. No evidence of hernia  Genitourinary:  External: Normal external female genitalia.  Urethra irritated with raw appearing skin, normal Bartholin's and Skene's glands.    Vagina: Normal vaginal mucosa, no evidence of prolapse.    Cervix: Grossly normal in appearance, no bleeding  Uterus: Non-enlarged, mobile, normal contour.  No CMT  Adnexa: ovaries non-enlarged, no adnexal masses  Rectal: deferred  Lymphatic: no evidence of inguinal lymphadenopathy Extremities: no edema, erythema, or tenderness Neurologic: Grossly intact Psychiatric: mood appropriate, affect full   Assessment: 38 y.o. Z6X0960 routine annual exam  Plan: Problem List Items Addressed This Visit   None Visit Diagnoses       Well woman exam  with routine gynecological exam    -  Primary   Relevant Orders   Hepatitis B surface antibody,qualitative (Completed)   Hepatitis C antibody (Completed)   HIV Antibody (routine testing w rflx) (Completed)   RPR Qual (Completed)   Cervicovaginal ancillary only   Cytology - PAP   HSV 1 and 2 Ab, IgG (Completed)     Encounter for removal and reinsertion of Nexplanon       Relevant Medications   etonogestrel (NEXPLANON) implant 68 mg (Completed)   Other Relevant Orders   POCT urine pregnancy (Completed)     Screen for sexually transmitted diseases       Relevant Orders   Hepatitis B surface antibody,qualitative (Completed)   Hepatitis C antibody (Completed)   HIV Antibody (routine testing w rflx) (Completed)   RPR Qual (Completed)   Cervicovaginal ancillary only   HSV 1 and 2 Ab, IgG (Completed)     Need for hepatitis B screening  test       Relevant Orders   Hepatitis B surface antibody,qualitative (Completed)     Need for hepatitis C screening test       Relevant Orders   Hepatitis C antibody (Completed)     Vaginal irritation       Relevant Orders   Cervicovaginal ancillary only     Screening for cervical cancer       Relevant Orders   Cytology - PAP     Hidradenitis suppurativa of multiple sites       Relevant Medications   doxycycline (VIBRAMYCIN) 100 MG capsule       1) STI screening  was offered and accepted  2)  ASCCP guidelines and rationale discussed.  Patient opts for every 3 years screening interval  3) Contraception - the patient is currently using  Nexplanon.  She is happy with her current form of contraception and plans to continue. See procedure note for today's removal and reinsertion  4) Routine healthcare maintenance including cholesterol, diabetes screening discussed Declines  5) Return in about 1 year (around 02/19/2025) for annual established gyn.   Tresea Mall, CNM [facility] 02/22/2024 5:03 PM

## 2024-02-22 NOTE — Progress Notes (Signed)
 GYNECOLOGY PROCEDURE NOTE  Nexplanon removal discussed in detail.  Risks of infection, bleeding, nerve injury all reviewed.  Patient understands risks and desires to proceed.   Nexplanon is expired 1 month ago and patient is certain she wants the Nexplanon removed. Urine pregnancy test is negative. All questions answered. Reinsertion today.   She understands that Nexplanon is a progesterone only therapy, and that patients often have irregular and unpredictable vaginal bleeding or amenorrhea. She understands that other side effects are possible related to systemic progesterone, including but not limited to, headaches, breast tenderness, nausea, and irritability. While effective at preventing pregnancy long acting reversible contraceptives do not prevent transmission of sexually transmitted diseases and use of barrier methods for this purpose was discussed. The placement procedure for Nexplanon was reviewed with the patient in detail including risks of nerve injury, infection, bleeding and injury to other muscles or tendons. She understands that the Nexplanon implant is good for 3 years and needs to be removed at the end of that time.  She understands that Nexplanon is an extremely effective option for contraception, with failure rate of <1%. This information is reviewed today and all questions were answered. Informed consent was obtained, both verbally and written.   The patient is healthy and has no contraindications to Nexplanon use.   Review of Systems  Constitutional:  Negative for chills and fever.  HENT:  Negative for congestion, ear discharge, ear pain, hearing loss, sinus pain and sore throat.   Eyes:  Negative for blurred vision and double vision.  Respiratory:  Negative for cough, shortness of breath and wheezing.   Cardiovascular:  Negative for chest pain, palpitations and leg swelling.  Gastrointestinal:  Negative for abdominal pain, blood in stool, constipation, diarrhea, heartburn,  melena, nausea and vomiting.  Genitourinary:  Negative for dysuria, flank pain, frequency, hematuria and urgency.       Positive for vaginal discharge  Musculoskeletal:  Positive for joint pain. Negative for back pain and myalgias.  Skin:  Negative for itching and rash.  Neurological:  Positive for headaches. Negative for dizziness, tingling, tremors, sensory change, speech change, focal weakness, seizures, loss of consciousness and weakness.  Endo/Heme/Allergies:  Negative for environmental allergies. Does not bruise/bleed easily.  Psychiatric/Behavioral:  Negative for depression, hallucinations, memory loss, substance abuse and suicidal ideas. The patient is not nervous/anxious and does not have insomnia.        Positive for anxiety    Vital Signs: BP (!) 146/93   Pulse (!) 58   Ht 5\' 8"  (1.727 m)   Wt 220 lb (99.8 kg)   LMP 02/09/2024   BMI 33.45 kg/m  Constitutional: Well nourished, well developed female in no acute distress.  Skin: Warm and dry.  Cardiovascular: Regular rate and rhythm.   Extremity:  no edema   Respiratory:  Normal respiratory effort Psych: Alert and Oriented x3. No memory deficits. Normal mood and affect.    Procedure: Patient placed in dorsal supine with left arm above head, elbow flexed at 90 degrees, arm resting on examination table. Nexplanon deep in left arm. Only able to palpate distal tip of device. Asked Dr Lonny Prude to evaluate Nexplanon for removal.  Betadine scrub x3.  2 ml of 1% lidocaine injected under Nexplanon device without problems.  Sterile gloves applied.  Small 0.5cm incision made at distal tip of Nexplanon device with 11 blade scalpel. Dr Lonny Prude was able to grasp tip of Nexplanon and bring it to the surface.  Nexplanon removed intact without  problems.  Pressure applied to incision.  Nexplanon removed form sterile blister packaging,  Device confirmed in needle, before inserting full length of needle, tenting up the skin as the needle was advanced.  Shallow insertion. The drug eluding rod was then deployed by pulling back the slider per the manufactures recommendation.  The implant was palpable by the clinician as well as the patient.  The insertion site was dressed with a steri strip and band aid before applying  a kerlex bandage pressure dressing. Minimal blood loss was noted during the procedure.  The patient tolerated the procedure well.   She was instructed to wear the bandage for 24 hours, call with any signs of infection.  She was given the Nexplanon card and instructed to have the rod removed in 3 years.   Assessment: 38 y.o. year old female now s/p Nexplanon removal and reinsertion.  Plan: 1.  Patient given post procedure precautions and asked to call for fever, chills, redness or drainage from her incision, bleeding from incision.  She understands she will likely have a small bruise near site of removal and reinsertion. She can remove bandage tomorrow and steri-strips in approximately 1 week.  2) Contraception: New Nexplanon placed today   Tresea Mall, CNM [facility] 02/22/2024 5:08 PM

## 2024-02-23 MED ORDER — VALACYCLOVIR HCL 500 MG PO TABS: 500.0000 mg | ORAL_TABLET | Freq: Two times a day (BID) | ORAL | 0 refills | Status: AC

## 2024-02-24 LAB — CERVICOVAGINAL ANCILLARY ONLY
Bacterial Vaginitis (gardnerella): POSITIVE — AB
Candida Glabrata: NEGATIVE
Candida Vaginitis: NEGATIVE
Chlamydia: NEGATIVE
Comment: NEGATIVE
Comment: NEGATIVE
Comment: NEGATIVE
Comment: NEGATIVE
Comment: NEGATIVE
Comment: NORMAL
Neisseria Gonorrhea: NEGATIVE
Trichomonas: POSITIVE — AB

## 2024-02-26 LAB — CYTOLOGY - PAP
Comment: NEGATIVE
Diagnosis: NEGATIVE
Diagnosis: REACTIVE
High risk HPV: NEGATIVE

## 2024-03-04 ENCOUNTER — Other Ambulatory Visit: Payer: Self-pay | Admitting: Advanced Practice Midwife

## 2024-03-04 DIAGNOSIS — B9689 Other specified bacterial agents as the cause of diseases classified elsewhere: Secondary | ICD-10-CM

## 2024-03-04 DIAGNOSIS — A5901 Trichomonal vulvovaginitis: Secondary | ICD-10-CM

## 2024-03-04 MED ORDER — METRONIDAZOLE 500 MG PO TABS
2000.0000 mg | ORAL_TABLET | Freq: Once | ORAL | 0 refills | Status: AC
Start: 2024-03-04 — End: 2024-03-04

## 2024-03-04 NOTE — Progress Notes (Signed)
 Message to patient regarding trich and bv and rx sent.
